# Patient Record
Sex: Male | Born: 1955 | Race: Black or African American | Hispanic: No | Marital: Single | Smoking: Current every day smoker
Health system: Southern US, Community
[De-identification: ages and names within clinical notes are randomized; demographics above are authoritative.]

## PROBLEM LIST (undated history)

## (undated) DIAGNOSIS — R011 Cardiac murmur, unspecified: Secondary | ICD-10-CM

---

## 1997-07-07 ENCOUNTER — Emergency Department (HOSPITAL_COMMUNITY): Admission: EM | Admit: 1997-07-07 | Discharge: 1997-07-07 | Payer: Self-pay | Admitting: Emergency Medicine

## 2006-01-05 ENCOUNTER — Emergency Department (HOSPITAL_COMMUNITY): Admission: EM | Admit: 2006-01-05 | Discharge: 2006-01-05 | Payer: Self-pay | Admitting: Emergency Medicine

## 2011-08-17 ENCOUNTER — Emergency Department (HOSPITAL_COMMUNITY): Payer: Self-pay

## 2011-08-17 ENCOUNTER — Inpatient Hospital Stay (HOSPITAL_COMMUNITY)
Admission: EM | Admit: 2011-08-17 | Discharge: 2011-08-19 | DRG: 066 | Disposition: A | Discharge status: Home / Self Care | Payer: Self-pay | Attending: Internal Medicine | Admitting: Internal Medicine

## 2011-08-17 ENCOUNTER — Encounter (HOSPITAL_COMMUNITY): Payer: Self-pay | Admitting: *Deleted

## 2011-08-17 DIAGNOSIS — I6350 Cerebral infarction due to unspecified occlusion or stenosis of unspecified cerebral artery: Principal | ICD-10-CM | POA: Diagnosis present

## 2011-08-17 DIAGNOSIS — R030 Elevated blood-pressure reading, without diagnosis of hypertension: Secondary | ICD-10-CM | POA: Diagnosis present

## 2011-08-17 DIAGNOSIS — F1210 Cannabis abuse, uncomplicated: Secondary | ICD-10-CM | POA: Diagnosis present

## 2011-08-17 DIAGNOSIS — R531 Weakness: Secondary | ICD-10-CM | POA: Diagnosis present

## 2011-08-17 DIAGNOSIS — F1910 Other psychoactive substance abuse, uncomplicated: Secondary | ICD-10-CM | POA: Diagnosis present

## 2011-08-17 DIAGNOSIS — Z8673 Personal history of transient ischemic attack (TIA), and cerebral infarction without residual deficits: Secondary | ICD-10-CM | POA: Diagnosis present

## 2011-08-17 DIAGNOSIS — R5381 Other malaise: Secondary | ICD-10-CM | POA: Diagnosis present

## 2011-08-17 DIAGNOSIS — F1410 Cocaine abuse, uncomplicated: Secondary | ICD-10-CM | POA: Diagnosis present

## 2011-08-17 DIAGNOSIS — IMO0001 Reserved for inherently not codable concepts without codable children: Secondary | ICD-10-CM | POA: Diagnosis present

## 2011-08-17 DIAGNOSIS — I639 Cerebral infarction, unspecified: Secondary | ICD-10-CM

## 2011-08-17 DIAGNOSIS — F17200 Nicotine dependence, unspecified, uncomplicated: Secondary | ICD-10-CM | POA: Diagnosis present

## 2011-08-17 DIAGNOSIS — G459 Transient cerebral ischemic attack, unspecified: Secondary | ICD-10-CM

## 2011-08-17 HISTORY — DX: Cardiac murmur, unspecified: R01.1

## 2011-08-17 LAB — COMPREHENSIVE METABOLIC PANEL
ALT: 13 U/L (ref 0–53)
BUN: 13 mg/dL (ref 6–23)
CO2: 26 mEq/L (ref 19–32)
Calcium: 9.1 mg/dL (ref 8.4–10.5)
Creatinine, Ser: 1.2 mg/dL (ref 0.50–1.35)
GFR calc Af Amer: 77 mL/min — ABNORMAL LOW (ref >90)
GFR calc non Af Amer: 66 mL/min — ABNORMAL LOW (ref >90)
Glucose, Bld: 96 mg/dL (ref 70–99)

## 2011-08-17 LAB — DIFFERENTIAL
Eosinophils Relative: 5 % (ref 0–5)
Lymphocytes Relative: 56 % — ABNORMAL HIGH (ref 12–46)
Lymphs Abs: 3.5 10*3/uL (ref 0.7–4.0)
Monocytes Absolute: 0.4 10*3/uL (ref 0.1–1.0)

## 2011-08-17 LAB — POCT I-STAT, CHEM 8
Calcium, Ion: 1.23 mmol/L (ref 1.12–1.32)
Chloride: 108 mEq/L (ref 96–112)
HCT: 39 % (ref 39.0–52.0)
TCO2: 24 mmol/L (ref 0–100)

## 2011-08-17 LAB — CBC
HCT: 37.3 % — ABNORMAL LOW (ref 39.0–52.0)
MCV: 91.2 fL (ref 78.0–100.0)
RBC: 4.09 MIL/uL — ABNORMAL LOW (ref 4.22–5.81)
WBC: 6.3 10*3/uL (ref 4.0–10.5)

## 2011-08-17 LAB — CK TOTAL AND CKMB (NOT AT ARMC)
CK, MB: 2.6 ng/mL (ref 0.3–4.0)
Relative Index: 1.2 (ref 0.0–2.5)

## 2011-08-17 NOTE — ED Notes (Signed)
Pt c/o of left sided weakness earlier today. Symptoms resolved prior to being roomed in the ER. Only complaint left pt has slightly less sensation on left side than right.

## 2011-08-17 NOTE — ED Provider Notes (Signed)
History     CSN: [id number]  Arrival date & time 08/17/11  2200   First MD Initiated Contact with Patient 08/17/11 2243      Chief Complaint  Patient presents with  . Weakness    (Consider location/radiation/quality/duration/timing/severity/associated sxs/prior treatment) HPI Comments: Patient arrives complaining of "cannot control the left side of my body". States he developed weakness, numbness and tingling in his left face, arm and leg about one hour ago. Symptoms lasted 30 minutes and now resolved. His similar episode yesterday that lasted about 10 minutes and resolved. EMS was called yesterday but did not transport him. He denies any history of high blood pressure diabetes. He admits to occasional cocaine and marijuana use. Last used cocaine 2 days ago. No chest pain, shortness of breath, headache, nausea, vomiting. No visual change. Difficulty talking or swallowing. Feels back to baseline now  The history is provided by the patient.    Past Medical History  Diagnosis Date  . Murmur     History reviewed. No pertinent past surgical history.  History reviewed. No pertinent family history.  History  Substance Use Topics  . Smoking status: Current Some Day Smoker -- 1.0 packs/day  . Smokeless tobacco: Not on file  . Alcohol Use: 0.6 oz/week    1 Shots of liquor per week     3x week      Review of Systems  Constitutional: Positive for activity change. Negative for fever.  HENT: Negative for congestion, rhinorrhea, neck pain and neck stiffness.   Eyes: Negative for visual disturbance.  Respiratory: Negative for cough, chest tightness and shortness of breath.   Cardiovascular: Negative for chest pain.  Gastrointestinal: Negative for nausea and abdominal pain.  Genitourinary: Negative for dysuria and hematuria.  Musculoskeletal: Negative for back pain.  Neurological: Positive for dizziness and weakness. Negative for speech difficulty and light-headedness.    Allergies    Review of patient's allergies indicates no known allergies.  Home Medications  No current outpatient prescriptions on file.  BP 155/98  Pulse 60  Temp(Src) 97.7 F (36.5 C) (Oral)  Resp 20  Ht 5\' 9"  (1.753 m)  Wt 165 lb (74.844 kg)  BMI 24.37 kg/m2  SpO2 100%  Physical Exam  Constitutional: He is oriented to person, place, and time. He appears well-developed and well-nourished. No distress.  HENT:  Head: Normocephalic and atraumatic.  Mouth/Throat: Oropharynx is clear and moist. No oropharyngeal exudate.  Eyes: Conjunctivae and EOM are normal. Pupils are equal, round, and reactive to light.  Neck: Normal range of motion. Neck supple.  Cardiovascular: Normal rate, regular rhythm and normal heart sounds.   No murmur heard. Pulmonary/Chest: Effort normal and breath sounds normal. No respiratory distress.  Abdominal: Soft. There is no tenderness. There is no rebound and no guarding.  Musculoskeletal: Normal range of motion. He exhibits no edema and no tenderness.  Neurological: He is alert and oriented to person, place, and time. No cranial nerve deficit.       5 out of 5 strength throughout, no facial asymmetry. Slight left nasolabial fold flattening. Smile symmetric, INR asymmetric, tongue midline. Grip Strength equal. No pronator drift. No ataxia finger to nose.  Skin: Skin is warm.    ED Course  Procedures (including critical care time)  Labs Reviewed  CBC - Abnormal; Notable for the following:    RBC 4.09 (*)    Hemoglobin 12.7 (*)    HCT 37.3 (*)    All other components within normal limits  DIFFERENTIAL -  Abnormal; Notable for the following:    Neutrophils Relative 33 (*)    Lymphocytes Relative 56 (*)    All other components within normal limits  COMPREHENSIVE METABOLIC PANEL - Abnormal; Notable for the following:    Total Protein 5.9 (*)    Albumin 3.2 (*)    Total Bilirubin 0.2 (*)    GFR calc non Af Amer 66 (*)    GFR calc Af Amer 77 (*)    All other  components within normal limits  PROTIME-INR  APTT  CK TOTAL AND CKMB  TROPONIN I  POCT I-STAT, CHEM 8  URINE RAPID DRUG SCREEN (HOSP PERFORMED)  URINALYSIS, ROUTINE W REFLEX MICROSCOPIC   Ct Head Wo Contrast  08/17/2011  *RADIOLOGY REPORT*  Clinical Data: Left-sided weakness  CT HEAD WITHOUT CONTRAST  Technique:  Contiguous axial images were obtained from the base of the skull through the vertex without contrast.  Comparison: None.  Findings: No evidence of parenchymal hemorrhage or extra-axial fluid collection. No mass lesion, mass effect, or midline shift.  No CT evidence of acute infarction.  Intracranial atherosclerosis.  Cerebral volume is age appropriate.  No ventriculomegaly.  Partial opacification of the bilateral ethmoid sinuses.  Visualized paranasal sinuses and mastoid air cells otherwise clear.  No evidence of calvarial fracture.  IMPRESSION: No evidence of acute intracranial abnormality.  Original Report Authenticated By: Charline Bills, M.D.     No diagnosis found.    MDM  Intermittent left-sided weakness twice in the past 2 days. Symptoms now resolved. Hypertensive. No chest pain or shortness of breath. No headache.  Left-sided weakness. Now resolved. Concern for TIA. Patient not thrombolytic candidate given rapid improvement in symptoms  CT head negative. Patient no further neurological deficits.     Date: 08/17/2011  Rate: 62  Rhythm: normal sinus rhythm  QRS Axis: normal  Intervals: normal  ST/T Wave abnormalities: normal  Conduction Disutrbances:none  Narrative Interpretation:   Old EKG Reviewed: unchanged    Glynn Octave, MD 08/18/11 0210

## 2011-08-17 NOTE — ED Notes (Signed)
Pt reports he felt Lt sided weakness about 1hr ago. Pt alert on arrival the patient denies a HX of CVA  Last known normal was 2100 tonight

## 2011-08-18 ENCOUNTER — Inpatient Hospital Stay (HOSPITAL_COMMUNITY): Payer: Self-pay

## 2011-08-18 DIAGNOSIS — R209 Unspecified disturbances of skin sensation: Secondary | ICD-10-CM

## 2011-08-18 DIAGNOSIS — R531 Weakness: Secondary | ICD-10-CM | POA: Diagnosis present

## 2011-08-18 DIAGNOSIS — F1910 Other psychoactive substance abuse, uncomplicated: Secondary | ICD-10-CM | POA: Diagnosis present

## 2011-08-18 DIAGNOSIS — F1410 Cocaine abuse, uncomplicated: Secondary | ICD-10-CM

## 2011-08-18 DIAGNOSIS — G459 Transient cerebral ischemic attack, unspecified: Secondary | ICD-10-CM

## 2011-08-18 DIAGNOSIS — R030 Elevated blood-pressure reading, without diagnosis of hypertension: Secondary | ICD-10-CM

## 2011-08-18 DIAGNOSIS — I639 Cerebral infarction, unspecified: Secondary | ICD-10-CM | POA: Diagnosis present

## 2011-08-18 HISTORY — DX: Weakness: R53.1

## 2011-08-18 LAB — URINE MICROSCOPIC-ADD ON

## 2011-08-18 LAB — RAPID URINE DRUG SCREEN, HOSP PERFORMED
Barbiturates: NOT DETECTED
Benzodiazepines: NOT DETECTED

## 2011-08-18 LAB — URINALYSIS, ROUTINE W REFLEX MICROSCOPIC
Ketones, ur: NEGATIVE mg/dL
Nitrite: NEGATIVE
pH: 6 (ref 5.0–8.0)

## 2011-08-18 LAB — LIPID PANEL
HDL: 101 mg/dL (ref >39)
LDL Cholesterol: 92 mg/dL (ref 0–99)
Triglycerides: 69 mg/dL (ref <150)
VLDL: 14 mg/dL (ref 0–40)

## 2011-08-18 MED ORDER — SENNOSIDES-DOCUSATE SODIUM 8.6-50 MG PO TABS: 1.0000 | ORAL_TABLET | Freq: Every evening | ORAL | Status: DC | PRN

## 2011-08-18 MED ORDER — ASPIRIN 325 MG PO TABS
325.0000 mg | ORAL_TABLET | Freq: Every day | ORAL | Status: DC
  Administered 2011-08-18 – 2011-08-19 (×2): 325 mg via ORAL
  Filled 2011-08-18 (×2): qty 1

## 2011-08-18 MED ORDER — SODIUM CHLORIDE 0.9 % IJ SOLN
3.0000 mL | Freq: Two times a day (BID) | INTRAMUSCULAR | Status: DC
  Administered 2011-08-18 – 2011-08-19 (×3): 3 mL via INTRAVENOUS

## 2011-08-18 MED ORDER — SODIUM CHLORIDE 0.9 % IV SOLN: INTRAVENOUS | Status: DC

## 2011-08-18 MED ORDER — SODIUM CHLORIDE 0.9 % IJ SOLN: 3.0000 mL | INTRAMUSCULAR | Status: DC | PRN

## 2011-08-18 MED ORDER — ASPIRIN 325 MG PO TABS: 325.0000 mg | ORAL_TABLET | Freq: Every day | ORAL | Status: AC

## 2011-08-18 MED ORDER — ASPIRIN 300 MG RE SUPP
300.0000 mg | Freq: Every day | RECTAL | Status: DC
  Filled 2011-08-18 (×2): qty 1

## 2011-08-18 MED ORDER — ASPIRIN 81 MG PO CHEW
324.0000 mg | CHEWABLE_TABLET | Freq: Once | ORAL | Status: AC
  Administered 2011-08-18: 324 mg via ORAL
  Filled 2011-08-18: qty 4

## 2011-08-18 MED ORDER — SODIUM CHLORIDE 0.9 % IV SOLN: 250.0000 mL | INTRAVENOUS | Status: DC | PRN

## 2011-08-18 NOTE — Evaluation (Signed)
Speech Language Pathology Evaluation Patient Details Name: Nevin Grizzle MRN: [id number] DOB: 1955-08-29 Today's Date: 08/18/2011 Time: 1200-1210 SLP Time Calculation (min): 10 min  Problem List:  Patient Active Problem List  Diagnoses  . Weakness of one side of body  . TIA (transient ischemic attack)  . Polysubstance abuse  . Elevated BP   Past Medical History:  Past Medical History  Diagnosis Date  . Murmur    Past Surgical History: History reviewed. No pertinent past surgical history. HPI:  56 year old male admitted 08/17/11 with left side weakness, now resolved. PMH significant for heart murmur, cocaine and marijuana use.   Assessment / Plan / Recommendation Clinical Impression  Pt currently at baseline for speech, language, and cognition.  No difficulty swallowing observed or reported.  No further acute SLP needs identified at this time.      SLP Assessment  Patient does not need any further Speech Lanaguage Pathology Services          Pertinent Vitals/Pain None reported     SLP Evaluation Prior Functioning  Cognitive/Linguistic Baseline: Within functional limits   Cognition  Overall Cognitive Status: Appears within functional limits for tasks assessed    Comprehension  Auditory Comprehension Overall Auditory Comprehension: Appears within functional limits for tasks assessed    Expression Expression Primary Mode of Expression: Verbal Verbal Expression Overall Verbal Expression: Appears within functional limits for tasks assessed   Oral / Motor Oral Motor/Sensory Function Overall Oral Motor/Sensory Function: Appears within functional limits for tasks assessed Motor Speech Overall Motor Speech: Appears within functional limits for tasks assessed   Deija Buhrman B. [place], [facility], CCC-SLP [phone]   Leigh Aurora 08/18/2011, 12:18 PM

## 2011-08-18 NOTE — Evaluation (Signed)
Physical Therapy Evaluation Patient Details Name: Shane Franklin MRN: [id number] DOB: [birth year] Today's Date: 08/18/2011 Time: 9629-5284 PT Time Calculation (min): 19 min  PT Assessment / Plan / Recommendation Clinical Impression  56 year old admitted with TIA. Pt appears back to baseline, no significant balance deficits noted. Pt scored 23/24 on the Dynamic Gait Index (<19 indicates increased risk for falls). No further therapies indicated at this time.  PT signing off.    PT Assessment  Patent does not need any further PT services    Follow Up Recommendations  No PT follow up       lEquipment Recommendations  None recommended by PT      Mobility  Bed Mobility Bed Mobility: Supine to Sit Supine to Sit: 7: Independent Transfers Transfers: Sit to Stand;Stand to Sit Sit to Stand: 7: Independent Stand to Sit: 7: Independent Ambulation/Gait Ambulation/Gait Assistance: 5: Supervision;6: Modified independent (Device/Increase time) Ambulation Distance (Feet): 300 Feet Assistive device: None Ambulation/Gait Assistance Details: Supervision initially progressing to modified independent. Gait Pattern: Step-through pattern Gait velocity: Slightly decreased speed, improved with time. Stairs: Yes Stairs Assistance: 5: Supervision;6: Modified independent (Device/Increase time) Stairs Assistance Details (indicate cue type and reason): Supervision progressing to modified independent Stair Management Technique: One rail Right;No rails Number of Stairs: 30  Modified Rankin (Stroke Patients Only) Pre-Morbid Rankin Score: No symptoms Modified Rankin: No symptoms    Exercises Other Exercises Other Exercises: Pt encouraged to walk as much as possible with nursing while here.    Visit Information  Last PT Received On: 08/18/11 Assistance Needed: +1    Subjective Data  Subjective: I'm good Patient Stated Goal: Get back to work   Prior [employer] Living Lives With: Other  (Comment) (brother) Available Help at Discharge: Family Type of Home: House Home Access: Stairs to enter [employer] of Steps: ~30 (house is on top of a hill) Entrance Stairs-Rails: Right Home Layout: One level Bathroom Shower/Tub: Engineer, manufacturing systems: Standard Bathroom Accessibility: Yes How Accessible: Accessible via walker Home Adaptive Equipment: None Additional Comments: WHen pt works in [place] does not stay. When with brother he will be able to proved 24/7 supervision. 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 Communication Communication: No difficulties Dominant Hand: Right    Cognition  Overall Cognitive Status: Appears within functional limits for tasks assessed/performed Arousal/Alertness: Awake/alert Orientation Level: Appears intact for tasks assessed Behavior During Session: Shane Franklin for tasks performed    Extremity/Trunk Assessment Right Upper Extremity Assessment RUE ROM/Strength/Tone: Within functional levels Left Upper Extremity Assessment LUE ROM/Strength/Tone: Within functional levels Right Lower Extremity Assessment RLE ROM/Strength/Tone: Within functional levels RLE Sensation: WFL - Light Touch Left Lower Extremity Assessment LLE ROM/Strength/Tone: Within functional levels LLE Sensation: WFL - Light Touch Trunk Assessment Trunk Assessment: Normal   Balance Balance Balance Assessed: Yes Standardized Balance Assessment Standardized Balance Assessment: Dynamic Gait Index Dynamic Gait Index Level Surface: Normal Change in Gait Speed: Normal Gait with Horizontal Head Turns: Normal Gait with Vertical Head Turns: Mild Impairment Gait and Pivot Turn: Normal Step Over Obstacle: Normal Step Around Obstacles: Normal Steps: Normal Total Score: 23   End of Session PT - End of  Session Equipment Utilized During Treatment: Gait belt Activity Tolerance: Patient tolerated treatment well Patient left: in bed;with call bell/phone within reach;with bed alarm set Nurse Communication: Mobility status   Shane Franklin 08/18/2011, 1:29  PM  Shane Franklin (Shane Franklin) Shane Franklin PT, DPT Acute Rehabilitation [phone]

## 2011-08-18 NOTE — Progress Notes (Signed)
Physical Therapy Discharge Patient Details Name: Cline Draheim MRN: [id number] DOB: [birth year] Today's Date: 08/18/2011 Time: 7829-5621 PT Time Calculation (min): 19 min  Patient discharged from PT services secondary to pt at baseline level of functioning..  Please see latest therapy progress note for current level of functioning and progress toward goals.    Progress and discharge plan discussed with patient and/or caregiver: Patient/Caregiver agrees with plan  [name] 08/18/2011, 1:30 PM

## 2011-08-18 NOTE — H&P (Signed)
Chief Complaint:  Left sided numbness and weakness  HPI: 56 year old male who has had several episodes of left arm and leg numbness and weakness that lasted less than 10 minutes with associated left-sided facial numbness also. His symptoms are currently resolved. He denies any fevers. He denies any headache. He denies any slurred speech. Has no known history of CVA in the past or hypertension. He does admit to doing cocaine and marijuana on occasion. no rashes. No chest pain or shortness of breath.   Review of Systems:   otherwise negative  Past Medical History: Past Medical History  Diagnosis Date  . Murmur    History reviewed. No pertinent past surgical history.  Medications: Prior to Admission medications   Not on File    Allergies:  No Known Allergies  Social History:  reports that he has been smoking.  He does not have any smokeless tobacco history on file. He reports that he drinks about .6 ounces of alcohol per week. He reports that he uses illicit drugs (Cocaine and Marijuana).  Family History: History reviewed. No pertinent family history.  Physical Exam: Filed Vitals:   08/17/11 2209 08/17/11 2320 08/17/11 2322  BP: 184/100 155/98   Pulse: 72 60   Temp: 97.9 F (36.6 C) 97.7 F (36.5 C) 97.7 F (36.5 C)  TempSrc: Oral Oral   Resp: 20    Height: 5\' 9"  (1.753 m)    Weight: 74.844 kg (165 lb)    SpO2: 95% 100%    General appearance: alert, cooperative and no distress Neck: no carotid bruit, no JVD and supple, symmetrical, trachea midline Lungs: clear to auscultation bilaterally Heart: regular rate and rhythm, S1, S2 normal, no murmur, click, rub or gallop Abdomen: soft, non-tender; bowel sounds normal; no masses,  no organomegaly Extremities: extremities normal, atraumatic, no cyanosis or edema Pulses: 2+ and symmetric Skin: Skin color, texture, turgor normal. No rashes or lesions Neurologic: Grossly normal    Labs on Admission:   [facility] 08/17/11  2326 08/17/11 2301  NA 143 144  K 3.7 4.0  CL 108 109  CO2 -- 26  GLUCOSE 94 96  BUN 13 13  CREATININE 1.10 1.20  CALCIUM -- 9.1  MG -- --  PHOS -- --    Basename 08/17/11 2301  AST 15  ALT 13  ALKPHOS 52  BILITOT 0.2*  PROT 5.9*  ALBUMIN 3.2*    Basename 08/17/11 2326 08/17/11 2301  WBC -- 6.3  NEUTROABS -- 2.1  HGB 13.3 12.7*  HCT 39.0 37.3*  MCV -- 91.2  PLT -- 151    Basename 08/17/11 2301  CKTOTAL 219  CKMB 2.6  CKMBINDEX --  TROPONINI <0.30    Radiological Exams on Admission: Ct Head Wo Contrast  08/17/2011  *RADIOLOGY REPORT*  Clinical Data: Left-sided weakness  CT HEAD WITHOUT CONTRAST  Technique:  Contiguous axial images were obtained from the base of the skull through the vertex without contrast.  Comparison: None.  Findings: No evidence of parenchymal hemorrhage or extra-axial fluid collection. No mass lesion, mass effect, or midline shift.  No CT evidence of acute infarction.  Intracranial atherosclerosis.  Cerebral volume is age appropriate.  No ventriculomegaly.  Partial opacification of the bilateral ethmoid sinuses.  Visualized paranasal sinuses and mastoid air cells otherwise clear.  No evidence of calvarial fracture.  IMPRESSION: No evidence of acute intracranial abnormality.  Original Report Authenticated By: Charline Bills, M.D.    Assessment/Plan Present on Admission:  56 year old male with TIA-like symptoms.  .Weakness  of one side of body .TIA (transient ischemic attack) .Polysubstance abuse .Elevated BP   place on CVA pathway with workup including MRI carotid Dopplers and 2-D echo. Her drug screen is pending. He does have elevated blood pressure without diagnosis of history of hypertension. Monitor for now will likely need to be started on antihypertensive prior to his discharge. Place on aspirin. Place on telemetry for any arrhythmias. 12 EKG is negative. Obtain frequent neurological checks. Further workup pending on the results of the  above.   Bessie Livingood [phone] 08/18/2011, 12:35 AM

## 2011-08-18 NOTE — Plan of Care (Signed)
Problem: Phase II Progression Outcomes Goal: Able to communicate Outcome: Completed/Met Date Met:  08/18/11 Dehlia Kilner B. [place], MSP, CCC-SLP [phone]

## 2011-08-18 NOTE — Progress Notes (Signed)
Subjective: Patient feeling much better. His weakness has completely resolved. He has no complaints. He says he is back to his baseline.  Objective: Weight change:   Intake/Output Summary (Last 24 hours) at 08/18/11 1933 Last data filed at 08/18/11 1300  Gross per 24 hour  Intake    720 ml  Output      0 ml  Net    720 ml    Filed Vitals:   08/18/11 1828  BP: 139/76  Pulse: 72  Temp: 97.8 F (36.6 C)  Resp: 18   General: Alert and oriented x3, no acute distress HEENT: Normocephalic, atraumatic, mucous members are moist. Cranial nerves II through XII are intact Cardiovascular: Regular rate and rhythm, S1-S2 Lungs: Clear to auscultation bilaterally Abdomen: Soft, nontender, nondistended, positive bowel sounds Extremities: No clubbing or cyanosis or edema Neuro: Nonfocal, all symmetric  Lab Results: Basic Metabolic Panel:  Basename 08/17/11 2326 08/17/11 2301  NA 143 144  K 3.7 4.0  CL 108 109  CO2 -- 26  GLUCOSE 94 96  BUN 13 13  CREATININE 1.10 1.20  CALCIUM -- 9.1  MG -- --  PHOS -- --   Liver Function Tests:  [facility] 08/17/11 2301  AST 15  ALT 13  ALKPHOS 52  BILITOT 0.2*  PROT 5.9*  ALBUMIN 3.2*   CBC:  Basename 08/17/11 2326 08/17/11 2301  WBC -- 6.3  NEUTROABS -- 2.1  HGB 13.3 12.7*  HCT 39.0 37.3*  MCV -- 91.2  PLT -- 151   Cardiac Enzymes:  Basename 08/17/11 2301  CKTOTAL 219  CKMB 2.6  CKMBINDEX --  TROPONINI <0.30   Hemoglobin A1C:  Basename 08/18/11 0520  HGBA1C 6.0*   Fasting Lipid Panel:  Basename 08/18/11 0520  CHOL 207*  HDL 101  LDLCALC 92  TRIG 69  CHOLHDL 2.0  LDLDIRECT --   Coagulation:  Basename 08/17/11 2301  LABPROT 12.0  INR 0.87   Urine Drug Screen: Drugs of Abuse     Component Value Date/Time   LABOPIA NONE DETECTED 08/18/2011 0132   COCAINSCRNUR POSITIVE* 08/18/2011 0132   LABBENZ NONE DETECTED 08/18/2011 0132   AMPHETMU NONE DETECTED 08/18/2011 0132   THCU POSITIVE* 08/18/2011 0132   LABBARB NONE  DETECTED 08/18/2011 0132   Urinalysis:  Basename 08/18/11 0130  COLORURINE YELLOW  LABSPEC 1.018  PHURINE 6.0  GLUCOSEU NEGATIVE  HGBUR NEGATIVE  BILIRUBINUR NEGATIVE  KETONESUR NEGATIVE  PROTEINUR NEGATIVE  UROBILINOGEN 0.2  NITRITE NEGATIVE  LEUKOCYTESUR TRACE*    Studies/Results: Ct Head Wo Contrast  08/17/2011  .  IMPRESSION: No evidence of acute intracranial abnormality.  Original Report Authenticated By: Charline Bills, M.D.   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    MRA HEAD   IMPRESSION: Negative MR angiography of the intracranial circulation.  No lesions are identified in the right internal carotid or right MCA vessels.      Medications: Scheduled Meds:   . aspirin  324 mg Oral Once  . aspirin  300 mg Rectal Daily   Or  . aspirin  325 mg Oral Daily  . sodium chloride  3 mL Intravenous Q12H  . DISCONTD: sodium chloride   Intravenous STAT   Continuous Infusions:  PRN Meds:.sodium chloride, senna-docusate, sodium chloride  Assessment/Plan: Patient Active Hospital Problem List: Acute CVA: Risk factors evaluated.blood pressure stable  no sign diabetes. Cholesterol LDL is 90. Will give a daily aspirin and he needs to avoid cocaine. Awaiting Dopplers and echocardiogram.  Weakness of one side of body (08/18/2011) Secondary to CVA-resolved.  Polysubstance abuse (08/18/2011) Counseled.  Elevated BP (08/18/2011)  BP better once cocaine out of system   LOS: 1 day   Lene Mckay K 08/18/2011, 7:33 PM

## 2011-08-19 DIAGNOSIS — R209 Unspecified disturbances of skin sensation: Secondary | ICD-10-CM

## 2011-08-19 DIAGNOSIS — R030 Elevated blood-pressure reading, without diagnosis of hypertension: Secondary | ICD-10-CM

## 2011-08-19 DIAGNOSIS — F1410 Cocaine abuse, uncomplicated: Secondary | ICD-10-CM

## 2011-08-19 DIAGNOSIS — G459 Transient cerebral ischemic attack, unspecified: Secondary | ICD-10-CM

## 2011-08-19 DIAGNOSIS — I6789 Other cerebrovascular disease: Secondary | ICD-10-CM

## 2011-08-19 NOTE — Discharge Summary (Signed)
DISCHARGE SUMMARY  [name]  MR#: [id number]  DOB:February 22, 1956  Date of Admission: 08/17/2011 Date of Discharge: 08/19/2011  Attending Physician:Aleem Elza K  Patient's Shane Franklin:WJXBJYN,WGNFAOZH, MD, MD  Consults: -none  Discharge Diagnoses: Present on Admission:  .Weakness of one side of body .CVA (cerebrovascular accident) .Polysubstance abuse .Elevated BP   Initial presentation: Patient is a 56 year old African American male past medical history of cocaine and marijuana abuse who presented with an episode of left arm and leg numbness and weakness that lasted about 10 minutes. He became concerned and came to the emergency room. His symptoms had resolved by the time he got to the emergency room. He is not had any previous history of any medical problems, although is not really ever seen a primary care physician. He was admitted to the hospitalist service for further workup and evaluation.  Hospital Course: Patient Active Problem List  Diagnoses  . Weakness of one side of body: Resolved see below.   . CVA (cerebrovascular accident): Patient's initial CT scan of the head was negative. However his MRI was positive for an acute CVA noting:Acute subcentimeter non hemorrhagic right MCA lenticulostriate  territory infarct affects the right posterior frontal  periventricular white matter.  The rest of his workup was unremarkable. Patient had negative carotid Dopplers. His cholesterol level was unremarkable at with an LDL of 70. Patient's blood pressure was elevated at times but at rest and his baseline is only 125/77. His A1c was at 6.0 I counseled him about exercise and relieved needing to be careful about his weight said that he does not go from prediabetic 2 diabetic. In the end, his major risk factor is of cocaine abuse and the plan will be for patient to stop doing drugs and to take a daily aspirin. He says he will indeed do so.   . Polysubstance abuse: Patient counseled.   .  Elevated BP: See above.     Medication List  As of 08/19/2011  3:40 PM   TAKE these medications         aspirin 325 MG tablet   Take 1 tablet (325 mg total) by mouth daily.             Day of Discharge BP 127/77  Pulse 63  Temp(Src) 98.5 F (36.9 C) (Oral)  Resp 18  Ht 5\' 9"  (1.753 m)  Wt 77.1 kg (169 lb 15.6 oz)  BMI 25.10 kg/m2  SpO2 98%  Physical Exam: General: Alert and oriented x3, no acute distress, looks about stated age, fatigued HEENT: Normocephalic, atraumatic, mucous membranes are moist Cardiovascular: Regular rate and rhythm, S1-S2 Lungs: Clear to auscultation bilaterally Abdomen: Soft, nontender, nondistended, positive bowel sounds Extremities: The clubbing or cyanosis or edema Neuro: Nonfocal, no deficits  Results for orders placed during the hospital encounter of 08/17/11 (from the past 24 hour(s))  GLUCOSE, CAPILLARY     Status: Abnormal   Collection Time   08/19/11  6:58 AM      Component Value Range   Glucose-Capillary 114 (*) 70 - 99 (mg/dL)    Disposition: improved, being discharged home. Patient was evaluated by speech therapy and found to have no deficits. His evaluated by PT and found to have no needs.   Follow-up Appts: patient will followup with neurology in 3 months.  He is being signed up for Medicaid and at that time will establish with a primary care physician.    Tests Needing Follow-up: none  Time spent in discharge (includes decision making & examination of  pt): 40 minutes  Signed: Hollice Espy 08/19/2011, 3:40 PM

## 2011-08-19 NOTE — Care Management Note (Signed)
    Page 1 of 1   08/19/2011     12:01:04 PM   CARE MANAGEMENT NOTE 08/19/2011  Patient:  Shane Franklin, Shane Franklin   Account Number:  [id number]  Date Initiated:  08/19/2011  Documentation initiated by:  Onnie Boer  Subjective/Objective Assessment:   PT WAS ADMITTED WITH WEAKNESS     Action/Plan:   PROGRESSION OF CARE AND DISCHARGE PLANNING   Anticipated DC Date:  08/21/2011   Anticipated DC Plan:  HOME/SELF CARE  In-house referral  Clinical Social Worker      DC Planning Services  CM consult      Choice offered to / List presented to:             Status of service:  In process, will continue to follow Medicare Important Message given?   (If response is "NO", the following Medicare IM given date fields will be blank) Date Medicare IM given:   Date Additional Medicare IM given:    Discharge Disposition:    Per UR Regulation:  Reviewed for med. necessity/level of care/duration of stay  If discussed at Long Length of Stay Meetings, dates discussed:    Comments:  08/19/11 Onnie Boer, RN, BSN 1159 PT WAS ADMITTED FOR CVA.  PTA PT WAS AT HOME WITH SELF CARE.  CSW HAS GIVEN PT INFORMATION ON APPLICATION FOR MEDICAID AND THE FINANCIAL COORDINATORS NUMBER.  I HAVE GIVEN THE PT INFORMATION ON THE [facility] FOR PCP APPTS. WILL F/U ON OTHER DC NEEDS.

## 2011-08-19 NOTE — Progress Notes (Signed)
*  PRELIMINARY RESULTS* Vascular Ultrasound Carotid Duplex (Doppler) has been completed.  No evidence of internal carotid artery stenosis bilaterally. Bilateral antegrade vertebral artery flow.  Malachy Moan, RDMS, RDCS 08/19/2011, 2:41 PM

## 2011-08-19 NOTE — Progress Notes (Signed)
Pt given diischarge instructions, stressed importance of taking medications, to ber aware of signs of TIA?stroke. And follow up with drs.  No RX to go. No questions at this time.

## 2011-08-19 NOTE — Evaluation (Signed)
Occupational Therapy Evaluation Patient Details Name: Shane Franklin MRN: [id number] DOB: [birth year] Today's Date: 08/19/2011 Time: 4098-1191 OT Time Calculation (min): 16 min  OT Assessment / Plan / Recommendation Clinical Impression  This 56 y.o. male admitted for small CVA.  Pt appears to be back to baseline.  He has reported a change in ability to read - words running together for ~ 8 months.  He was able to read small newspaper print during eval.  Vision appears to be at baseline, per pt. report, but recommended he see ophthalmogist.  He verbalized undertanding    OT Assessment  Patient does not need any further OT services    Follow Up Recommendations  No OT follow up    Barriers to Discharge      Equipment Recommendations  None recommended by OT    Recommendations for Other Services    Frequency       Precautions / Restrictions         ADL  Eating/Feeding: Simulated;Independent Where Assessed - Eating/Feeding: Bed level ADL Comments: Pt. reports he has been performing ADLs in room independently.  Pt. is independent with ADLs and no physical limitations.  Pt. instructed in s/s of CVA -- pt. able to teach back ~50% of information.  Verbalizes understanding of need to seek immediate medical attention if he experiences signs of CVA.  Pt. also asking about Hypertension - instructed him to discuss further with MD, but did discuss drug/cocaine use and negative effects on his system including hypertension.  He verbalized understanding    OT Diagnosis:    OT Problem List:   OT Treatment Interventions:     OT Goals    Visit Information  Last OT Received On: 08/19/11 Assistance Needed: +1    Subjective Data  Subjective: "I've been having trouble reading for about 8 months.  The words all run together" Patient Stated Goal: Did not state   Prior Functioning  Home Living Lives With: Other (Comment) (brother) Available Help at Discharge: Family Type of Home: House Home  Access: Stairs to enter [employer] of Steps: ~30 (house is on top of a hill) Entrance Stairs-Rails: Right Home Layout: One level Bathroom Shower/Tub: Engineer, manufacturing systems: Standard Bathroom Accessibility: Yes How Accessible: Accessible via walker Home Adaptive Equipment: None Additional Comments: WHen pt works in [place] does not stay. When with brother he will be able to proved 24/7 supervision. Prior Function Level of Independence: Independent Able to Take Stairs?: Yes Driving: No Vocation: Part time employment Communication Communication: No difficulties Dominant Hand: Right    Cognition  Overall Cognitive Status: Appears within functional limits for tasks assessed/performed Arousal/Alertness: Awake/alert Orientation Level: Appears intact for tasks assessed Behavior During Session: [facility] for tasks performed    Extremity/Trunk Assessment Right Upper Extremity Assessment RUE ROM/Strength/Tone: Within functional levels RUE Sensation: WFL - Light Touch RUE Coordination: WFL - gross/fine motor Left Upper Extremity Assessment LUE ROM/Strength/Tone: Within functional levels LUE Sensation: WFL - Light Touch LUE Coordination: WFL - gross/fine motor   Mobility     Exercise    Balance    End of Session OT - End of Session Activity Tolerance: Patient tolerated treatment well Patient left: in bed;with call bell/phone within reach   Winferd Wease M 08/19/2011, 2:38 PM

## 2011-08-19 NOTE — Clinical Social Work Psychosocial (Signed)
     Clinical Social Work Department BRIEF PSYCHOSOCIAL ASSESSMENT 08/19/2011  Patient:  Shane Franklin, Shane Franklin     Account Number:  [id number]     Admit date:  08/17/2011  Clinical Social Worker:  Peggyann Shoals  Date/Time:  08/19/2011 11:31 AM  Referred by:  Physician  Date Referred:  08/18/2011 Referred for  Other - See comment   Other Referral:   Assistance with Medicaid   Interview type:  Patient Other interview type:    PSYCHOSOCIAL DATA Living Status:  FAMILY Admitted from facility:   Level of care:   Primary support name:  Cecilio Asper Primary support relationship to patient:  FRIEND Degree of support available:   Adequate.    CURRENT CONCERNS Current Concerns  Other - See comment   Other Concerns:   Access to Medicaid    SOCIAL WORK ASSESSMENT / PLAN CSW met with pt to address consult. CSW introduced herself and explained role of social work. Pt lives with his brother and works part time. Pt lacks insurance. Pt is interested in applying for Medicaid. CSW explained Medicaid process. Pt shared that he understood and did not have any questions at this time. CSW provided information on [facility] DSS to apply for Medicaid. CSW also contacted [employer] regarding pt's case. CSW is signing off as no further needs identified. RNCM is aware and following pt as well. Please reconsult if a need arises prior to discharge. Pt will be discharging home.   Assessment/plan status:  No Further Intervention Required Other assessment/ plan:   Information/referral to community resources:   [facility] DSS for applying for [employer] and other services.    PATIENTS/FAMILYS RESPONSE TO PLAN OF CARE: Pt was alert and oriented. Pt shared that he will be applying for Medicaid. Pt is agreeable to discharge plan to home.

## 2011-08-19 NOTE — Discharge Instructions (Signed)
STROKE/TIA DISCHARGE INSTRUCTIONS SMOKING Cigarette smoking nearly doubles your risk of having a stroke & is the single most alterable risk factor  If you smoke or have smoked in the last 12 months, you are advised to quit smoking for your health.  Most of the excess cardiovascular risk related to smoking disappears within a year of stopping.  Ask you doctor about anti-smoking medications  [place] Quit Line: 1-800-QUIT NOW  Free Smoking Cessation Classes [phone]  CHOLESTEROL Know your levels; limit fat & cholesterol in your diet  Lipid Panel     Component Value Date/Time   CHOL 207* 08/18/2011 0520   TRIG 69 08/18/2011 0520   HDL 101 08/18/2011 0520   CHOLHDL 2.0 08/18/2011 0520   VLDL 14 08/18/2011 0520   LDLCALC 92 08/18/2011 0520      Many patients benefit from treatment even if their cholesterol is at goal.  Goal: Total Cholesterol (CHOL) less than 160  Goal:  Triglycerides (TRIG) less than 150  Goal:  HDL greater than 40  Goal:  LDL (LDLCALC) less than 100   BLOOD PRESSURE American Stroke Association blood pressure target is less that 120/80 mm/Hg  Your discharge blood pressure is:  BP: 137/86 mmHg  Monitor your blood pressure  Limit your salt and alcohol intake  Many individuals will require more than one medication for high blood pressure  DIABETES (A1c is a blood sugar average for last 3 months) Goal HGBA1c is under 7% (HBGA1c is blood sugar average for last 3 months)  Diabetes: {STROKE DC DIABETES:22357}    Lab Results  Component Value Date   HGBA1C 6.0* 08/18/2011     Your HGBA1c can be lowered with medications, healthy diet, and exercise.  Check your blood sugar as directed by your physician  Call your physician if you experience unexplained or low blood sugars.  PHYSICAL ACTIVITY/REHABILITATION Goal is 30 minutes at least 4 days per week    {STROKE DC ACTIVITY/REHAB:22359}  Activity decreases your risk of heart attack and stroke and makes your heart stronger.   It helps control your weight and blood pressure; helps you relax and can improve your mood.  Participate in a regular exercise program.  Talk with your doctor about the best form of exercise for you (dancing, walking, swimming, cycling).  DIET/WEIGHT Goal is to maintain a healthy weight  Your discharge diet is: Cardiac *** liquids Your height is:  Height: 5\' 9"  (175.3 cm) Your current weight is: Weight: 77.1 kg (169 lb 15.6 oz) Your Body Mass Index (BMI) is:  BMI (Calculated): 25.2   Following the type of diet specifically designed for you will help prevent another stroke.  Your goal weight range is:  ***  Your goal Body Mass Index (BMI) is 19-24.  Healthy food habits can help reduce 3 risk factors for stroke:  High cholesterol, hypertension, and excess weight.  RESOURCES Stroke/Support Group:  Call [phone]  they meet the 3rd Sunday of the month on the Rehab Unit at [facility], [place] ( no meetings June, July & Aug).  STROKE EDUCATION PROVIDED/REVIEWED AND GIVEN TO PATIENT Stroke warning signs and symptoms How to activate emergency medical system (call 911). Medications prescribed at discharge. Need for follow-up after discharge. Personal risk factors for stroke. Pneumonia vaccine given:   {STROKE DC YES/NO/DATE:22363} Flu vaccine given:   {STROKE DC YES/NO/DATE:22363} My questions have been answered, the writing is legible, and I understand these instructions.  I will adhere to these goals & educational materials that have  been provided to me after my discharge from the hospital.

## 2013-01-01 ENCOUNTER — Emergency Department (HOSPITAL_COMMUNITY): Payer: Self-pay

## 2013-01-01 ENCOUNTER — Encounter (HOSPITAL_COMMUNITY): Payer: Self-pay | Admitting: Emergency Medicine

## 2013-01-01 ENCOUNTER — Emergency Department (HOSPITAL_COMMUNITY)
Admission: EM | Admit: 2013-01-01 | Discharge: 2013-01-01 | Disposition: A | Discharge status: Home / Self Care | Payer: Self-pay | Attending: Emergency Medicine | Admitting: Emergency Medicine

## 2013-01-01 DIAGNOSIS — Y9389 Activity, other specified: Secondary | ICD-10-CM | POA: Insufficient documentation

## 2013-01-01 DIAGNOSIS — F17200 Nicotine dependence, unspecified, uncomplicated: Secondary | ICD-10-CM | POA: Insufficient documentation

## 2013-01-01 DIAGNOSIS — S60229A Contusion of unspecified hand, initial encounter: Secondary | ICD-10-CM | POA: Insufficient documentation

## 2013-01-01 DIAGNOSIS — S60221A Contusion of right hand, initial encounter: Secondary | ICD-10-CM

## 2013-01-01 DIAGNOSIS — IMO0002 Reserved for concepts with insufficient information to code with codable children: Secondary | ICD-10-CM | POA: Insufficient documentation

## 2013-01-01 DIAGNOSIS — R011 Cardiac murmur, unspecified: Secondary | ICD-10-CM | POA: Insufficient documentation

## 2013-01-01 DIAGNOSIS — Y929 Unspecified place or not applicable: Secondary | ICD-10-CM | POA: Insufficient documentation

## 2013-01-01 MED ORDER — IBUPROFEN 600 MG PO TABS: 600.0000 mg | ORAL_TABLET | Freq: Four times a day (QID) | ORAL | Status: DC | PRN

## 2013-01-01 MED ORDER — HYDROCODONE-ACETAMINOPHEN 5-325 MG PO TABS: 1.0000 | ORAL_TABLET | ORAL | Status: DC | PRN

## 2013-01-01 NOTE — ED Notes (Signed)
Pt. States that Wednesday a week ago he hit a wall. States it has been swollen since that time. States he is able to move fingers.

## 2013-01-01 NOTE — ED Provider Notes (Signed)
CSN: [id number]     Arrival date & time 01/01/13  1726 History  This chart was scribed for non-physician practitioner Marlon Pel working with Enid Skeens, MD by Carl Best, ED Scribe. This patient was seen in room TR06C/TR06C and the patient's care was started at 6:46 PM.       Chief Complaint  Patient presents with  . Hand Problem    The history is provided by the patient. No language interpreter was used.   HPI Comments: Shane Franklin is a 57 y.o. male who presents to the Emergency Department complaining of right hand pain that started a week ago after the patient punched a wall.  The patient denies nausea, emesis, and fever as associated symptoms.  The patient states that he iced the right hand one time to reduce the swelling but did not experience any relief in his symptoms.  The patient states that he is able to move his fingers.  The patient denies having an open wound on the right hand. No fevers  Past Medical History  Diagnosis Date  . Murmur    History reviewed. No pertinent past surgical history. History reviewed. No pertinent family history. History  Substance Use Topics  . Smoking status: Current Some Day Smoker -- 1.00 packs/day  . Smokeless tobacco: Not on file  . Alcohol Use: 0.6 oz/week    1 Shots of liquor per week     Comment: every other day either wine cooler or liquor    Review of Systems  Constitutional: Negative for fever.  Gastrointestinal: Negative for nausea and vomiting.  Musculoskeletal: Positive for arthralgias (right hand) and joint swelling (right hand).  Skin: Negative for wound.  All other systems reviewed and are negative.    Allergies  Review of patient's allergies indicates no known allergies.  Home Medications   Current Outpatient Rx  Name  Route  Sig  Dispense  Refill  . naproxen sodium (ALEVE) 220 MG tablet   Oral   Take 220 mg by mouth 2 (two) times daily as needed (pain).         Marland Kitchen HYDROcodone-acetaminophen  (NORCO/VICODIN) 5-325 MG per tablet   Oral   Take 1 tablet by mouth every 4 (four) hours as needed for pain.   12 tablet   0   . ibuprofen (ADVIL,MOTRIN) 600 MG tablet   Oral   Take 1 tablet (600 mg total) by mouth every 6 (six) hours as needed for pain.   30 tablet   0    Triage Vitals: BP 156/91  Pulse 84  Temp(Src) 98.1 F (36.7 C) (Oral)  Resp 20  Wt 165 lb (74.844 kg)  BMI 24.36 kg/m2  SpO2 98%  Physical Exam  Nursing note and vitals reviewed. Constitutional: He appears well-developed and well-nourished. No distress.  HENT:  Head: Normocephalic and atraumatic.  Eyes: Pupils are equal, round, and reactive to light.  Neck: Normal range of motion. Neck supple.  Cardiovascular: Normal rate and regular rhythm.   Pulmonary/Chest: Effort normal.  Abdominal: Soft.  Musculoskeletal:       Right hand: He exhibits tenderness, bony tenderness and swelling. He exhibits normal range of motion, normal two-point discrimination, normal capillary refill, no deformity and no laceration. Normal sensation noted. Normal strength noted.  Neurological: He is alert.  Skin: Skin is warm and dry.    ED Course  Procedures (including critical care time)  DIAGNOSTIC STUDIES: Oxygen Saturation is 98% on room air, normal by my interpretation.  COORDINATION OF CARE: 6:49 PM- Discussed placing an ACE bandage on the right hand and discharging the patient with an antiinflammatory medication and vicodin.  Advised the patient to take ibuprofen and ice the right hand for the next couple of days to reduce the swelling.  The patient agreed to the treatment plan.     Labs Review Labs Reviewed - No data to display Imaging Review Dg Hand Complete Right  01/01/2013   CLINICAL DATA:  Punched a wall a week ago  EXAM: RIGHT HAND - COMPLETE 3+ VIEW  COMPARISON:  None  FINDINGS: Three views of the right hand submitted. No acute fracture or subluxation. Degenerative changes interphalangeal joint of the  thumb . Degenerative changes distal interphalangeal joint 2nd and 3rd finger. No displaced fracture or subluxation. Mild narrowing of radiocarpal joint space. Diffuse soft tissue swelling noted metacarpal region.  IMPRESSION: No displaced fracture or subluxation. Degenerative changes as described above. Diffuse soft tissue swelling metacarpal region.   Electronically Signed   By: Natasha Mead M.D.   On: 01/01/2013 18:09    EKG Interpretation   None       MDM   1. Hand contusion, right, initial encounter      Referral to Ortho. 57 y.o.Shane Franklin's evaluation in the Emergency Department is complete. It has been determined that no acute conditions requiring further emergency intervention are present at this time. The patient/guardian have been advised of the diagnosis and plan. We have discussed signs and symptoms that warrant return to the ED, such as changes or worsening in symptoms.  Vital signs are stable at discharge. Filed Vitals:   01/01/13 1730  BP: 156/91  Pulse: 84  Temp: 98.1 F (36.7 C)  Resp: 20    Patient/guardian has voiced understanding and agreed to follow-up with the PCP or specialist.  I personally performed the services described in this documentation, which was scribed in my presence. The recorded information has been reviewed and is accurate.    Dorthula Matas, PA-C 01/01/13 1904  Dorthula Matas, PA-C 01/01/13 1906

## 2013-01-02 NOTE — ED Provider Notes (Signed)
Medical screening examination/treatment/procedure(s) were performed by non-physician practitioner and as supervising physician I was immediately available for consultation/collaboration.  EKG Interpretation   None         Treylon Henard M Briston Lax, MD 01/02/13 0007 

## 2014-10-28 ENCOUNTER — Encounter (HOSPITAL_COMMUNITY): Payer: Self-pay | Admitting: Nurse Practitioner

## 2014-10-28 ENCOUNTER — Emergency Department (HOSPITAL_COMMUNITY)
Admission: EM | Admit: 2014-10-28 | Discharge: 2014-10-28 | Disposition: A | Discharge status: Home / Self Care | Payer: Self-pay | Attending: Emergency Medicine | Admitting: Emergency Medicine

## 2014-10-28 DIAGNOSIS — M25511 Pain in right shoulder: Secondary | ICD-10-CM | POA: Insufficient documentation

## 2014-10-28 DIAGNOSIS — R011 Cardiac murmur, unspecified: Secondary | ICD-10-CM | POA: Insufficient documentation

## 2014-10-28 DIAGNOSIS — M545 Low back pain, unspecified: Secondary | ICD-10-CM

## 2014-10-28 DIAGNOSIS — K5900 Constipation, unspecified: Secondary | ICD-10-CM | POA: Insufficient documentation

## 2014-10-28 DIAGNOSIS — Z720 Tobacco use: Secondary | ICD-10-CM | POA: Insufficient documentation

## 2014-10-28 MED ORDER — METHOCARBAMOL 500 MG PO TABS: 1000.0000 mg | ORAL_TABLET | Freq: Four times a day (QID) | ORAL | Status: DC | PRN

## 2014-10-28 MED ORDER — METHOCARBAMOL 500 MG PO TABS
1000.0000 mg | ORAL_TABLET | Freq: Once | ORAL | Status: AC
  Administered 2014-10-28: 1000 mg via ORAL
  Filled 2014-10-28: qty 2

## 2014-10-28 NOTE — Discharge Instructions (Signed)
Drink plenty of water and do not take any NSAIDs (aspirin, Aleve, naproxen, Motrin, ibuprofen).  You can also take  tylenol (acetaminophen) 975mg  (this is 3 over the counter pills) four times a day. Do not drink alcohol or combine with other medications that have acetaminophen as an ingredient (Read the labels!).    For breakthrough pain you may take Robaxin. Do not drink alcohol, drive or operate heavy machinery when taking Robaxin.  Do not hesitate to return to the emergency room for any new, worsening or concerning symptoms.  Please obtain primary care using resource guide below. Let them know that you were seen in the emergency room and that they will need to obtain records for further outpatient management.  Please follow with your primary care doctor in the next 5 days for high blood pressure evaluation. If you do not have a primary care doctor, present to urgent care. Reduce salt intake. Seek emergency medical care for unilateral weakness, slurring, change in vision, or chest pain and shortness of breath.  Emergency Department Resource Guide 1) Find a Doctor and Pay Out of Pocket Although you won't have to find out who is covered by your insurance plan, it is a good idea to ask around and get recommendations. You will then need to call the office and see if the doctor you have chosen will accept you as a new patient and what types of options they offer for patients who are self-pay. Some doctors offer discounts or will set up payment plans for their patients who do not have insurance, but you will need to ask so you aren't surprised when you get to your appointment.  2) Contact Your Local Health Department Not all health departments have doctors that can see patients for sick visits, but many do, so it is worth a call to see if yours does. If you don't know where your local health department is, you can check in your phone book. The CDC also has a tool to help you locate your state's health  department, and many state websites also have listings of all of their local health departments.  3) Find a Walk-in Clinic If your illness is not likely to be very severe or complicated, you may want to try a walk in clinic. These are popping up all over the country in pharmacies, drugstores, and shopping centers. They're usually staffed by nurse practitioners or physician assistants that have been trained to treat common illnesses and complaints. They're usually fairly quick and inexpensive. However, if you have serious medical issues or chronic medical problems, these are probably not your best option.  No Primary Care Doctor: - Call Health Connect at  [phone] - they can help you locate a primary care doctor that  accepts your insurance, provides certain services, etc. - Physician Referral Service- [phone]  Chronic Pain Problems: Organization         Address  Phone   Notes  [facility]  [phone] Patients need to be referred by their primary care doctor.   Medication Assistance: Organization         Address  Phone   Notes  [facility] Medication [facility] [address] [place] [place], [place] [address] [phone] --Must be a resident of [facility] -- Must have NO insurance coverage whatsoever (no Medicaid/ Medicare, etc.) -- The pt. MUST have a primary care doctor that directs their care regularly and follows them in the community   MedAssist  (  [phone]   [employer]  [phone]    Agencies that provide inexpensive medical care: Organization         Address  Phone   Notes  [facility]  [phone]   [facility]    [phone]   [facility] [address] [place], [place] [address] [phone]   [facility] [address], [place] [phone]   Planned Parenthood    5752414459   [facility]    [phone]    [facility] and [facility]  [address], [place] Phone:  [phone], Fax:  [phone] Hours of Operation:  9 am - 6 pm, M-F.  Also accepts Medicaid/Medicare and self-pay.  [facility]  [address], [place] Phone: [phone], Fax: [phone]. Hours of Operation:  8:30 am - 5:30 pm, M-F.  Also accepts Medicaid and self-pay.  [facility] [place] [address] [place] Phone: [phone]   [facility] [address] Natasha Bence [place], [place] [phone], Ext. [phone] Mondays & Thursdays: 7-9 AM.  First 15 patients are seen on a first come, first serve basis.    Medicaid-accepting [facility] Providers:  Organization         Address  Phone   Notes  [facility] [address], [place] [phone] Also accepts self-pay patients.  [facility] [address] Laurell Josephs [place], [place]  [phone]   [facility] [address], [place] [phone]   [facility] [address], [place] [phone]   Renaye Rakers [address], [place]   [phone] Only accepts [place] Access [place] patients after they have their name applied to their card.   Self-Pay (no insurance) in [facility]:  Organization         Address  Phone   Notes  Sickle Cell Patients, [facility] [address] [place], [place] [phone]   [facility] [address] [place], [place] [phone]   [facility] [place]  [address], [place] [phone]   [facility]/Dr. Osei-Bonsu  [address], [place] or [address], [place] [phone] Phone number for both [place] and [place] locations is the same.  [facility]gent [facility] and [facility] [address] [place] [phone]    [facility] [address] [place] or [address] [phone]   [facility] [address] [place] [phone], phone; [phone], fax Sees patients 1st and 3rd Saturday of every month.  Must not qualify for public or private insurance (i.e. Medicaid, Medicare, [place] Health Choice, Veterans' Benefits)  Household income should be no more than 200% of the poverty level The clinic cannot treat you if you are pregnant or think you are pregnant  Sexually transmitted diseases are not treated at the clinic.    Dental Care: Organization         Address  Phone  Notes  [facility] Department of [facility] [address] [place], [place] [phone] Accepts children up to age 82 who are enrolled in [place] or [place] Health Choice; pregnant women  with a Medicaid card; and children who have applied for Medicaid or [place] Health Choice, but were declined, whose parents can pay a reduced fee at time of service.  [facility] Department of [facility]  [address], [place] [phone] Accepts children up to age 74 who are enrolled in [place] or [place] Health Choice; pregnant women with a Medicaid card; and children who have applied for Medicaid or [place] Health Choice, but were declined, whose parents can pay a reduced fee at time of service.  [facility]  [address] [place], [place] [phone] Patients are seen by appointment only. Walk-ins are not accepted. [facility] will see patients 56 years of age and older. Monday - Tuesday (8am-5pm) Most Wednesdays (8:30-5pm) $30 per visit, cash only  [facility] Access [facility]  [address], [facility] [phone] Patients are seen by appointment only. Walk-ins are not accepted. [facility] will see patients 76 years of age and older. One Wednesday Evening (Monthly: Volunteer Based).   $30 per visit, cash only  [employer]  [phone] for adults; Children under age 29, call Graduate Pediatric Dentistry at [phone]. Children aged 77-14, please call [phone] to request a pediatric application.  Dental services are provided in all areas of dental care including fillings, crowns and bridges, complete and partial dentures, implants, gum treatment, root canals, and extractions. Preventive care is also provided. Treatment is provided to both adults and children. Patients are selected via a lottery and there is often a waiting list.   [facility] [address] [place]  [phone] [email or web address]   [facility] [address] [place], [place] [phone], Ext. [phone] Second and Fourth Thursday of each month, opens at 6:30 AM; Clinic ends at 9 AM.  Patients are seen on a first-come first-served basis, and a limited number are seen during each clinic.   [facility]  [address] Ether Griffins [place], [place] [phone]   Eligibility Requirements You must have lived in [place], [place], or [place] counties for at least the last three months.   You cannot be eligible for state or federal sponsored [place], including [employer], [place], or [employer].   You generally cannot be eligible for healthcare insurance through your employer.    How to apply: Eligibility screenings are held every Tuesday and Wednesday afternoon from 1:00 pm until 4:00 pm. You do not need an appointment for the interview!  [facility] [address] [place], [place] [phone]   [facility] Health Department  [phone]   [facility] Health Department  [phone]   [facility] Health Department  [phone]    Behavioral Health Resources in the Community: Intensive Outpatient Programs Organization         Address  Phone  Notes  [facility] [address], [place], [place] [phone]   [facility] Outpatient [address], [place], [place] [phone]   [facility] [address], [place], [place]  [phone]   [facility] [address]  [place], [place] [phone] or [phone]   Substance Abuse Resources Organization         Address  Phone  Notes  [facility] Drug Services  [phone]   [facility]  [phone]   [facility] [place]  [phone]   [facility]  [phone]   Residential & Outpatient Substance Abuse Program  [phone]   Psychological Services Organization         Address  Phone  Notes  [facility] Ethel  [place]  [phone]   [place] [address] [place] or [phone]    Mobile Crisis Teams Organization         Address  Phone  Notes  [facility], Mobile Crisis Care Unit  [phone]   [facility]  [address] [place], [place]   [place] [facility] [address] [place] [phone]    Self-Help/Support Groups Organization         Address  Phone             Notes  [place]. of [place] - variety of support groups  [facility] Call for more information  Narcotics Anonymous (NA), [facility] [address], [employer] [place]  2 meetings at this location   Special educational needs teacher         Address  Phone  Notes  [facility] [place],    [place]  [phone]   [facility]  [address] [place] [address], [place], [place]   [place], [place] [phone] Admissions: 8am-3pm M-F  Incentives Substance [place] [address],    [place], [place] [phone]   [facility] [address] [place], [place], [place]   [facility] [address],  [place], [place]   [facility] - Intensive Outpatient [address] Kristeen [address], [place], [place]   [facility] ([place] [address]  [place], [place] [phone] or [phone]   [facility] ([facility]) [address] [place], [place] Accepts Medicaid  [facility] [place] [address],  [place] [phone] Substance Abuse/Addiction Treatment   [facility] Organization         Address  Phone  Notes  [facility]  [phone]   Domenic Schwab, PhD [address] Arlis Porta [place], [place]   [phone] or [phone]   [facility] [place], [place] [phone]   [facility] [address] [place], [place] [phone] Insurance/Medicaid/sponsorship through [facility] and Families [address], [address] [place]                                    [place], [place] [phone] [place] [address], [place] [phone]    Dr. Adele Schilder  [phone]   [facility] Dept. 1) [address], [place] 2) [place] 3)  [address], [place] [phone]  [phone]   [place] [phone] or [phone] (After Hours)

## 2014-10-28 NOTE — ED Notes (Addendum)
He c/o lower back pain radiating down both legs x 1 week. Started after waking to get out of bed one morning. He also c/o constipation x 3 weeks, tried OTC laxatives with no relief. States the back pain seems worse when he is constipated. hes also had a headache for 3 days. He tried OTC tylenol arthritis with no rleief

## 2014-10-28 NOTE — ED Provider Notes (Signed)
CSN: [id number]     Arrival date & time 10/28/14  1452 History  This chart was scribed for Shane Emery, PA-C, working with Shane Mocha, MD by Shane Franklin, ED Scribe. This patient was seen in room TR02C/TR02C and the patient's care was started at 4:36 PM.   Chief Complaint  Patient presents with  . Back Pain   The history is provided by the patient. No language interpreter was used.   HPI Comments: Shane Franklin is a 59 y.o. male who presents to the Emergency Department complaining of new, constant, moderate, unchanged lower back pain with "spasming" radiation down the bilateral legs onset two weeks ago. He has taken 3-4 Aleves per dose since onset and Tylenol with relief (most recently 2 Aleve this morning and 2 Tylenol PTA).  He is able to ambulate normal to baseline.  He denies hx of CA, IVDU.  He denies abdominal pain, n/v, fever, chills.    He also complains of a throbbing, resolved headache onset 3 days ago lasting two days.  The day prior to onset, the patient was working in an attic and hit his head multiple times in multiple locations on the rafters.  He denies use of anti-coagulants.  He denies visible injury, LOC, weakness.   He also complains of intermittent constipation (2-3 days without bowel movement) the past several months relieved by OTC laxatives.    Past Medical History  Diagnosis Date  . Murmur    History reviewed. No pertinent past surgical history. History reviewed. No pertinent family history. Social History  Substance Use Topics  . Smoking status: Current Some Day Smoker -- 1.00 packs/day  . Smokeless tobacco: None  . Alcohol Use: 0.6 oz/week    1 Shots of liquor per week     Comment: every other day either wine cooler or liquor    Review of Systems  Gastrointestinal: Positive for constipation.  Musculoskeletal: Positive for myalgias and back pain.  All other systems reviewed and are negative.     Allergies  Review of patient's allergies indicates  no known allergies.  Home Medications   Prior to Admission medications   Medication Sig Start Date End Date Taking? Authorizing Provider  HYDROcodone-acetaminophen (NORCO/VICODIN) 5-325 MG per tablet Take 1 tablet by mouth every 4 (four) hours as needed for pain. 01/01/13   Shane Neva Seat, PA-C  ibuprofen (ADVIL,MOTRIN) 600 MG tablet Take 1 tablet (600 mg total) by mouth every 6 (six) hours as needed for pain. 01/01/13   Shane Pel, PA-C  naproxen sodium (ALEVE) 220 MG tablet Take 220 mg by mouth 2 (two) times daily as needed (pain).    Historical Provider, MD   BP 164/91 mmHg  Pulse 75  Temp(Src) 98 F (36.7 C) (Oral)  Resp 16  SpO2 97% Physical Exam  Constitutional: He is oriented to person, place, and time. He appears well-developed and well-nourished. No distress.  HENT:  Head: Normocephalic and atraumatic.  Mouth/Throat: Oropharynx is clear and moist.  Eyes: Conjunctivae and EOM are normal. Pupils are equal, round, and reactive to light.  No TTP of maxillary or frontal sinuses  No TTP or induration of temporal arteries bilaterally  Neck: Normal range of motion. Neck supple. No tracheal deviation present.  FROM to C-spine. Pt can touch chin to chest without discomfort. No TTP of midline cervical spine.   Cardiovascular: Normal rate, regular rhythm and intact distal pulses.   Pulmonary/Chest: Effort normal and breath sounds normal. No respiratory distress. He has no wheezes. He has  no rales. He exhibits no tenderness.  Abdominal: Soft. Bowel sounds are normal. There is no tenderness.  Musculoskeletal: Normal range of motion. He exhibits tenderness. He exhibits no edema.       Arms: Right shoulder:  Shoulder with no deformity. FROM to shoulder and elbow. No TTP of rotator cuff musculature. Drop arm negative. Neurovascularly intact   Neurological: He is alert and oriented to person, place, and time. No cranial nerve deficit.  No point tenderness to percussion of lumbar  spinal processes.  No TTP or paraspinal muscular spasm. Strength is 5 out of 5 to bilateral lower extremities at hip and knee; extensor hallucis longus 5 out of 5. Ankle strength 5 out of 5, no clonus, neurovascularly intact. No saddle anaesthesia. Patellar reflexes are 2+ bilaterally.       Skin: Skin is warm and dry. He is not diaphoretic.  Psychiatric: He has a normal mood and affect. His behavior is normal.  Nursing note and vitals reviewed.   ED Course  Procedures (including critical care time)  DIAGNOSTIC STUDIES: Oxygen Saturation is 97% on RA, normal by my interpretation.    COORDINATION OF CARE:  4:45 PM Discussed treatment plan with patient at bedside.  Patient acknowledges and agrees with plan.    Labs Review Labs Reviewed - No data to display  Imaging Review No results found. I have personally reviewed and evaluated these images and lab results as part of my medical decision-making.   EKG Interpretation None      MDM   Final diagnoses:  Midline low back pain without sciatica    Filed Vitals:   10/28/14 1512 10/28/14 1653  BP: 164/91 169/85  Pulse: 75 61  Temp: 98 F (36.7 C) 97.5 F (36.4 C)  TempSrc: Oral Oral  Resp: 16 14  SpO2: 97% 100%    Medications  methocarbamol (ROBAXIN) tablet 1,000 mg (1,000 mg Oral Given 10/28/14 1649)    Shane Franklin is a pleasant 59 y.o. male presenting with right shoulder and low back pain.  No neurological deficits and normal neuro exam.  Patient can walk but states is painful.  No loss of bowel or bladder control.  No concern for cauda equina.  No fever, night sweats, weight loss, h/o cancer, IVDU.  RICE protocol and pain medicine indicated and discussed with patient.  Patient also reports a severe headache which resolved. This is a posttraumatic headache, he is not anticoagulated. Patient's neuro exam today is nonfocal. No indication for neuroimaging at this time.  Evaluation does not show pathology that would  require ongoing emergent intervention or inpatient treatment. Pt is hemodynamically stable and mentating appropriately. Discussed findings and plan with patient/guardian, who agrees with care plan. All questions answered. Return precautions discussed and outpatient follow up given.   Discharge Medication List as of 10/28/2014  4:47 PM    START taking these medications   Details  methocarbamol (ROBAXIN) 500 MG tablet Take 2 tablets (1,000 mg total) by mouth 4 (four) times daily as needed (Pain)., Starting 10/28/2014, Until Discontinued, Print         I personally performed the services described in this documentation, which was scribed in my presence. The recorded information has been reviewed and is accurate.   Shane Emery, PA-C 10/28/14 1736  Blane Ohara, MD 10/29/14 [phone]

## 2014-12-22 ENCOUNTER — Encounter: Payer: Self-pay | Admitting: General Practice

## 2014-12-22 ENCOUNTER — Ambulatory Visit: Payer: Self-pay | Admitting: Internal Medicine

## 2016-02-23 ENCOUNTER — Emergency Department (HOSPITAL_COMMUNITY)
Admission: EM | Admit: 2016-02-23 | Discharge: 2016-02-23 | Disposition: A | Discharge status: Home / Self Care | Payer: Self-pay | Attending: Emergency Medicine | Admitting: Emergency Medicine

## 2016-02-23 ENCOUNTER — Encounter (HOSPITAL_COMMUNITY): Payer: Self-pay | Admitting: Emergency Medicine

## 2016-02-23 ENCOUNTER — Emergency Department (HOSPITAL_COMMUNITY): Payer: Self-pay

## 2016-02-23 DIAGNOSIS — Z8673 Personal history of transient ischemic attack (TIA), and cerebral infarction without residual deficits: Secondary | ICD-10-CM | POA: Insufficient documentation

## 2016-02-23 DIAGNOSIS — F17200 Nicotine dependence, unspecified, uncomplicated: Secondary | ICD-10-CM | POA: Insufficient documentation

## 2016-02-23 DIAGNOSIS — M25511 Pain in right shoulder: Secondary | ICD-10-CM | POA: Insufficient documentation

## 2016-02-23 DIAGNOSIS — I10 Essential (primary) hypertension: Secondary | ICD-10-CM | POA: Insufficient documentation

## 2016-02-23 LAB — BASIC METABOLIC PANEL
Anion gap: 9 (ref 5–15)
BUN: 14 mg/dL (ref 6–20)
CHLORIDE: 104 mmol/L (ref 101–111)
CO2: 24 mmol/L (ref 22–32)
CREATININE: 1.08 mg/dL (ref 0.61–1.24)
Calcium: 9.1 mg/dL (ref 8.9–10.3)
GFR calc Af Amer: >60 mL/min (ref >60)
GFR calc non Af Amer: >60 mL/min (ref >60)
Glucose, Bld: 98 mg/dL (ref 65–99)
POTASSIUM: 3.6 mmol/L (ref 3.5–5.1)
Sodium: 137 mmol/L (ref 135–145)

## 2016-02-23 LAB — CBC
HCT: 40.1 % (ref 39.0–52.0)
Hemoglobin: 13.9 g/dL (ref 13.0–17.0)
MCH: 30.9 pg (ref 26.0–34.0)
MCHC: 34.7 g/dL (ref 30.0–36.0)
MCV: 89.1 fL (ref 78.0–100.0)
Platelets: 182 10*3/uL (ref 150–400)
RBC: 4.5 MIL/uL (ref 4.22–5.81)
RDW: 13.4 % (ref 11.5–15.5)
WBC: 6.6 10*3/uL (ref 4.0–10.5)

## 2016-02-23 LAB — TROPONIN I: Troponin I: <0.03 ng/mL (ref <0.03)

## 2016-02-23 MED ORDER — ONDANSETRON HCL 4 MG/2ML IJ SOLN
4.0000 mg | Freq: Once | INTRAMUSCULAR | Status: AC
  Administered 2016-02-23: 4 mg via INTRAVENOUS
  Filled 2016-02-23: qty 2

## 2016-02-23 MED ORDER — SODIUM CHLORIDE 0.9 % IV SOLN: INTRAVENOUS | Status: DC

## 2016-02-23 MED ORDER — MORPHINE SULFATE (PF) 4 MG/ML IV SOLN
4.0000 mg | Freq: Once | INTRAVENOUS | Status: AC
  Administered 2016-02-23: 4 mg via INTRAVENOUS
  Filled 2016-02-23: qty 1

## 2016-02-23 MED ORDER — TRAMADOL HCL 50 MG PO TABS: 50.0000 mg | ORAL_TABLET | Freq: Four times a day (QID) | ORAL | 0 refills | Status: DC | PRN

## 2016-02-23 NOTE — ED Notes (Signed)
Pt verbalizes understanding of discharge instructions. A/o x4. Wheeled to waiting room to await family member to transport patient home.

## 2016-02-23 NOTE — ED Notes (Signed)
EMS reports t wave inversion on 12 lead with rate in the 50's.

## 2016-02-23 NOTE — ED Triage Notes (Signed)
Pt to ER by [facility] from home with complaint of right shoulder pain x 1 week. EMS reports on arrival to patient home patient was diaphoretic with BP 90 palpated. Pt denies chest pain or shortness of breath. Denies trauma or injury to arm. Reports daily crack use as well as daily drinker. Pt is a/o x4. Reports pain is worse with movement. Tender on palpation.

## 2016-02-23 NOTE — ED Provider Notes (Signed)
[facility]-EMERGENCY DEPT Provider Note   CSN: [id number] Arrival date & time: 02/23/16  0810     History   Chief Complaint Chief Complaint  Patient presents with  . Shoulder Pain    HPI Shane Franklin is a 60 y.o. male.  Patient c/o right shoulder pain for the past 1-2 weeks. Pain constant, dull, severe, persistent, worse w moving of right shoulder/arm. Denies injury. No hx chr shoulder problems. Denies fever or chills. No numbness/tingling down arm. No neck pain. Denies chest pain or sob.   Denies other body/joint pain.     The history is provided by the patient.    Past Medical History:  Diagnosis Date  . Murmur     Patient Active Problem List   Diagnosis Date Noted  . Weakness of one side of body 08/18/2011  . CVA (cerebrovascular accident) (HCC) 08/18/2011  . Polysubstance abuse 08/18/2011  . Elevated BP 08/18/2011    History reviewed. No pertinent surgical history.     Home Medications    Prior to Admission medications   Medication Sig Start Date End Date Taking? Authorizing Provider  HYDROcodone-acetaminophen (NORCO/VICODIN) 5-325 MG per tablet Take 1 tablet by mouth every 4 (four) hours as needed for pain. 01/01/13   Tiffany Neva Seat, PA-C  ibuprofen (ADVIL,MOTRIN) 600 MG tablet Take 1 tablet (600 mg total) by mouth every 6 (six) hours as needed for pain. 01/01/13   Tiffany Neva Seat, PA-C  methocarbamol (ROBAXIN) 500 MG tablet Take 2 tablets (1,000 mg total) by mouth 4 (four) times daily as needed (Pain). 10/28/14   Nicole Pisciotta, PA-C  naproxen sodium (ALEVE) 220 MG tablet Take 220 mg by mouth 2 (two) times daily as needed (pain).    Historical Provider, MD    Family History History reviewed. No pertinent family history.  Social History Social History  Substance Use Topics  . Smoking status: Current Some Day Smoker    Packs/day: 1.00  . Smokeless tobacco: Never Used  . Alcohol use 0.6 oz/week    1 Shots of liquor per week     Comment: every other day  either wine cooler or liquor     Allergies   Patient has no known allergies.   Review of Systems Review of Systems  Constitutional: Negative for chills and fever.  HENT: Negative for sore throat.   Eyes: Negative for redness.  Respiratory: Negative for cough and shortness of breath.   Cardiovascular: Negative for chest pain.  Gastrointestinal: Negative for abdominal pain and vomiting.  Genitourinary: Negative for flank pain.  Musculoskeletal: Negative for back pain and neck pain.  Skin: Negative for rash.  Neurological: Negative for weakness, numbness and headaches.  Hematological: Does not bruise/bleed easily.  Psychiatric/Behavioral: Negative for confusion.     Physical Exam Updated Vital Signs There were no vitals taken for this visit.  Physical Exam  Constitutional: He is oriented to person, place, and time. He appears well-developed and well-nourished. No distress.  HENT:  Mouth/Throat: Oropharynx is clear and moist.  Eyes: Conjunctivae are normal.  Neck: Neck supple. No tracheal deviation present.  Cardiovascular: Normal rate, regular rhythm, normal heart sounds and intact distal pulses.  Exam reveals no gallop and no friction rub.   No murmur heard. Pulmonary/Chest: Effort normal and breath sounds normal. No accessory muscle usage. No respiratory distress.  Abdominal: Soft. Bowel sounds are normal. He exhibits no distension. There is no tenderness.  Musculoskeletal: He exhibits no edema.  Tenderness right shoulder, and pain w active rom right shoulder. No  swelling noted. No erythema or increased warmth. Radial pulse 2+.   Neurological: He is alert and oriented to person, place, and time.  RUE motor 5/5, sens intact.   Skin: Skin is warm and dry. No rash noted. He is not diaphoretic. No erythema.  Psychiatric: He has a normal mood and affect.  Nursing note and vitals reviewed.    ED Treatments / Results  Labs (all labs ordered are listed, but only abnormal  results are displayed)  Results for orders placed or performed during the hospital encounter of 02/23/16  Basic metabolic panel  Result Value Ref Range   Sodium 137 135 - 145 mmol/L   Potassium 3.6 3.5 - 5.1 mmol/L   Chloride 104 101 - 111 mmol/L   CO2 24 22 - 32 mmol/L   Glucose, Bld 98 65 - 99 mg/dL   BUN 14 6 - 20 mg/dL   Creatinine, Ser 4.131.08 0.61 - 1.24 mg/dL   Calcium 9.1 8.9 - 24.410.3 mg/dL   GFR calc non Af Amer >60 >60 mL/min   GFR calc Af Amer >60 >60 mL/min   Anion gap 9 5 - 15  CBC  Result Value Ref Range   WBC 6.6 4.0 - 10.5 K/uL   RBC 4.50 4.22 - 5.81 MIL/uL   Hemoglobin 13.9 13.0 - 17.0 g/dL   HCT 01.040.1 27.239.0 - 53.652.0 %   MCV 89.1 78.0 - 100.0 fL   MCH 30.9 26.0 - 34.0 pg   MCHC 34.7 30.0 - 36.0 g/dL   RDW 64.413.4 03.411.5 - 74.215.5 %   Platelets 182 150 - 400 K/uL  Troponin I  Result Value Ref Range   Troponin I <0.03 <0.03 ng/mL   Dg Shoulder Right  Result Date: 02/23/2016 CLINICAL DATA:  All right shoulder pain EXAM: RIGHT SHOULDER - 2+ VIEW COMPARISON:  None. FINDINGS: There is no fracture or dislocation. There are mild degenerative changes of the acromioclavicular joint. IMPRESSION: No acute osseous injury of the right shoulder. Electronically Signed   By: Elige KoHetal  Patel   On: 02/23/2016 09:22    EKG  EKG Interpretation None       Radiology Dg Shoulder Right  Result Date: 02/23/2016 CLINICAL DATA:  All right shoulder pain EXAM: RIGHT SHOULDER - 2+ VIEW COMPARISON:  None. FINDINGS: There is no fracture or dislocation. There are mild degenerative changes of the acromioclavicular joint. IMPRESSION: No acute osseous injury of the right shoulder. Electronically Signed   By: Elige KoHetal  Patel   On: 02/23/2016 09:22    Procedures Procedures (including critical care time)  Medications Ordered in ED Medications  0.9 %  sodium chloride infusion (not administered)  morphine 4 MG/ML injection 4 mg (not administered)  ondansetron (ZOFRAN) injection 4 mg (not administered)      Initial Impression / Assessment and Plan / ED Course  I have reviewed the triage vital signs and the nursing notes.  Pertinent labs & imaging results that were available during my care of the patient were reviewed by me and considered in my medical decision making (see chart for details).  Clinical Course     Iv ns. Morphine iv. zofran iv. Labs. Xr.   Reviewed nursing notes and prior charts for additional history.   Recheck pt comfortable. No distress.   ?rotator cuff related pain ?tendonitis. ?djd/arthritis.   Currently no findings of septic joint on exam, minimal discomfort w passive rom. No fever.    Patient currently appears stable for d/c.   rec close outpt pcp  f/u.   Final Clinical Impressions(s) / ED Diagnoses   Final diagnoses:  None    New Prescriptions New Prescriptions   No medications on file     Cathren LaineKevin Devaun Hernandez, MD 02/23/16 1009

## 2016-02-23 NOTE — Discharge Instructions (Signed)
It was our pleasure to provide your ER care today - we hope that you feel better.  Take motrin or aleve as need for pain (available over the counter).  You may also take ultram as need for pain - no driving when taking.  Your blood pressure is high today - follow up with primary care doctor for both your shoulder pain, as well as for recheck of blood pressure in the coming week - see referral - call office today to arrange appointment.   Return to ER right away if worse, new symptoms, fevers, redness/swelling to shoulder, worsening or intractable pain, other concern.   You were given pain medication in the ER - no driving for the next 4 hours.

## 2016-03-28 ENCOUNTER — Encounter (HOSPITAL_COMMUNITY): Payer: Self-pay | Admitting: Emergency Medicine

## 2016-03-28 ENCOUNTER — Emergency Department (HOSPITAL_COMMUNITY)
Admission: EM | Admit: 2016-03-28 | Discharge: 2016-03-28 | Disposition: A | Discharge status: Home / Self Care | Payer: Self-pay | Attending: Emergency Medicine | Admitting: Emergency Medicine

## 2016-03-28 ENCOUNTER — Emergency Department (HOSPITAL_COMMUNITY): Payer: Self-pay

## 2016-03-28 DIAGNOSIS — Z79899 Other long term (current) drug therapy: Secondary | ICD-10-CM | POA: Insufficient documentation

## 2016-03-28 DIAGNOSIS — F17200 Nicotine dependence, unspecified, uncomplicated: Secondary | ICD-10-CM | POA: Insufficient documentation

## 2016-03-28 DIAGNOSIS — R030 Elevated blood-pressure reading, without diagnosis of hypertension: Secondary | ICD-10-CM | POA: Insufficient documentation

## 2016-03-28 DIAGNOSIS — Y999 Unspecified external cause status: Secondary | ICD-10-CM | POA: Insufficient documentation

## 2016-03-28 DIAGNOSIS — Z8673 Personal history of transient ischemic attack (TIA), and cerebral infarction without residual deficits: Secondary | ICD-10-CM | POA: Insufficient documentation

## 2016-03-28 DIAGNOSIS — S0181XA Laceration without foreign body of other part of head, initial encounter: Secondary | ICD-10-CM | POA: Insufficient documentation

## 2016-03-28 DIAGNOSIS — Y939 Activity, unspecified: Secondary | ICD-10-CM | POA: Insufficient documentation

## 2016-03-28 DIAGNOSIS — Z23 Encounter for immunization: Secondary | ICD-10-CM | POA: Insufficient documentation

## 2016-03-28 DIAGNOSIS — Y929 Unspecified place or not applicable: Secondary | ICD-10-CM | POA: Insufficient documentation

## 2016-03-28 MED ORDER — TETANUS-DIPHTH-ACELL PERTUSSIS 5-2.5-18.5 LF-MCG/0.5 IM SUSP
0.5000 mL | Freq: Once | INTRAMUSCULAR | Status: AC
  Administered 2016-03-28: 0.5 mL via INTRAMUSCULAR
  Filled 2016-03-28: qty 0.5

## 2016-03-28 MED ORDER — BACITRACIN ZINC 500 UNIT/GM EX OINT
1.0000 "application " | TOPICAL_OINTMENT | Freq: Once | CUTANEOUS | Status: AC
  Administered 2016-03-28: 1 via TOPICAL
  Filled 2016-03-28: qty 0.9

## 2016-03-28 MED ORDER — HYDROCODONE-ACETAMINOPHEN 5-325 MG PO TABS: ORAL_TABLET | ORAL | 0 refills | Status: DC

## 2016-03-28 MED ORDER — HYDROCODONE-ACETAMINOPHEN 5-325 MG PO TABS
1.0000 | ORAL_TABLET | Freq: Once | ORAL | Status: AC
  Administered 2016-03-28: 1 via ORAL
  Filled 2016-03-28: qty 1

## 2016-03-28 NOTE — ED Notes (Signed)
Pt transported to CT ?

## 2016-03-28 NOTE — Discharge Instructions (Signed)
Take vicodin for breakthrough pain, do not drink alcohol, drive, care for children or do other critical tasks while taking vicodin.  Please follow with your primary care doctor in the next 5 days for high blood pressure evaluation. If you do not have a primary care doctor, present to urgent care. Reduce salt intake. Seek emergency medical care for unilateral weakness, slurring, change in vision, or chest pain and shortness of breath.  Do not hesitate to return to the emergency room for any new, worsening or concerning symptoms.  Please obtain primary care using resource guide below. Let them know that you were seen in the emergency room and that they will need to obtain records for further outpatient management.
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
## 2016-03-28 NOTE — ED Notes (Signed)
Using clean technique, cleansed facial wound with dermal wound cleaner. Applied BACITRACIN ointment to the area. Pt tolerated it well. Instructed patient on how to care wound at home.

## 2016-11-06 ENCOUNTER — Emergency Department (HOSPITAL_COMMUNITY): Payer: Self-pay

## 2016-11-06 ENCOUNTER — Emergency Department (HOSPITAL_COMMUNITY)
Admission: EM | Admit: 2016-11-06 | Discharge: 2016-11-06 | Disposition: A | Discharge status: Home / Self Care | Payer: Self-pay | Attending: Emergency Medicine | Admitting: Emergency Medicine

## 2016-11-06 ENCOUNTER — Encounter (HOSPITAL_COMMUNITY): Payer: Self-pay | Admitting: Emergency Medicine

## 2016-11-06 DIAGNOSIS — K5900 Constipation, unspecified: Secondary | ICD-10-CM

## 2016-11-06 DIAGNOSIS — M5442 Lumbago with sciatica, left side: Secondary | ICD-10-CM | POA: Insufficient documentation

## 2016-11-06 DIAGNOSIS — Z8673 Personal history of transient ischemic attack (TIA), and cerebral infarction without residual deficits: Secondary | ICD-10-CM | POA: Insufficient documentation

## 2016-11-06 DIAGNOSIS — K5909 Other constipation: Secondary | ICD-10-CM | POA: Insufficient documentation

## 2016-11-06 DIAGNOSIS — F17200 Nicotine dependence, unspecified, uncomplicated: Secondary | ICD-10-CM | POA: Insufficient documentation

## 2016-11-06 DIAGNOSIS — R011 Cardiac murmur, unspecified: Secondary | ICD-10-CM | POA: Insufficient documentation

## 2016-11-06 LAB — URINALYSIS, ROUTINE W REFLEX MICROSCOPIC
Bilirubin Urine: NEGATIVE
Glucose, UA: NEGATIVE mg/dL
Hgb urine dipstick: NEGATIVE
Ketones, ur: NEGATIVE mg/dL
Leukocytes, UA: NEGATIVE
NITRITE: NEGATIVE
Protein, ur: NEGATIVE mg/dL
SPECIFIC GRAVITY, URINE: 1.014 (ref 1.005–1.030)
pH: 6 (ref 5.0–8.0)

## 2016-11-06 LAB — COMPREHENSIVE METABOLIC PANEL
ALBUMIN: 4.2 g/dL (ref 3.5–5.0)
ALK PHOS: 69 U/L (ref 38–126)
ALT: 17 U/L (ref 17–63)
AST: 23 U/L (ref 15–41)
Anion gap: 8 (ref 5–15)
BILIRUBIN TOTAL: 0.9 mg/dL (ref 0.3–1.2)
BUN: 16 mg/dL (ref 6–20)
CALCIUM: 9.3 mg/dL (ref 8.9–10.3)
CO2: 25 mmol/L (ref 22–32)
Chloride: 103 mmol/L (ref 101–111)
Creatinine, Ser: 1.15 mg/dL (ref 0.61–1.24)
GFR calc non Af Amer: >60 mL/min (ref >60)
GLUCOSE: 96 mg/dL (ref 65–99)
POTASSIUM: 4.5 mmol/L (ref 3.5–5.1)
Sodium: 136 mmol/L (ref 135–145)
Total Protein: 7.7 g/dL (ref 6.5–8.1)

## 2016-11-06 LAB — CBC WITH DIFFERENTIAL/PLATELET
BASOS ABS: 0 10*3/uL (ref 0.0–0.1)
BASOS PCT: 0 %
Eosinophils Absolute: 0.1 10*3/uL (ref 0.0–0.7)
Eosinophils Relative: 2 %
HEMATOCRIT: 44.9 % (ref 39.0–52.0)
HEMOGLOBIN: 15 g/dL (ref 13.0–17.0)
LYMPHS PCT: 40 %
Lymphs Abs: 1.8 10*3/uL (ref 0.7–4.0)
MCH: 30.9 pg (ref 26.0–34.0)
MCHC: 33.4 g/dL (ref 30.0–36.0)
MCV: 92.4 fL (ref 78.0–100.0)
MONO ABS: 0.5 10*3/uL (ref 0.1–1.0)
Monocytes Relative: 11 %
NEUTROS PCT: 47 %
Neutro Abs: 2.1 10*3/uL (ref 1.7–7.7)
Platelets: 207 10*3/uL (ref 150–400)
RBC: 4.86 MIL/uL (ref 4.22–5.81)
RDW: 14.1 % (ref 11.5–15.5)
WBC: 4.5 10*3/uL (ref 4.0–10.5)

## 2016-11-06 LAB — LIPASE, BLOOD: Lipase: 22 U/L (ref 11–51)

## 2016-11-06 MED ORDER — ONDANSETRON HCL 4 MG/2ML IJ SOLN
4.0000 mg | Freq: Once | INTRAMUSCULAR | Status: AC
  Administered 2016-11-06: 4 mg via INTRAVENOUS
  Filled 2016-11-06: qty 2

## 2016-11-06 MED ORDER — KETOROLAC TROMETHAMINE 30 MG/ML IJ SOLN
15.0000 mg | Freq: Once | INTRAMUSCULAR | Status: AC
  Administered 2016-11-06: 15 mg via INTRAVENOUS
  Filled 2016-11-06: qty 1

## 2016-11-06 MED ORDER — NAPROXEN 375 MG PO TABS: 375.0000 mg | ORAL_TABLET | Freq: Two times a day (BID) | ORAL | 0 refills | Status: DC

## 2016-11-06 MED ORDER — MORPHINE SULFATE (PF) 4 MG/ML IV SOLN
4.0000 mg | Freq: Once | INTRAVENOUS | Status: AC
  Administered 2016-11-06: 4 mg via INTRAVENOUS
  Filled 2016-11-06: qty 1

## 2016-11-06 MED ORDER — CYCLOBENZAPRINE HCL 10 MG PO TABS: 10.0000 mg | ORAL_TABLET | Freq: Three times a day (TID) | ORAL | 0 refills | Status: DC | PRN

## 2016-11-06 MED ORDER — IOPAMIDOL (ISOVUE-300) INJECTION 61%
INTRAVENOUS | Status: AC
  Administered 2016-11-06: 100 mL
  Filled 2016-11-06: qty 100

## 2016-11-06 MED ORDER — CYCLOBENZAPRINE HCL 10 MG PO TABS
5.0000 mg | ORAL_TABLET | Freq: Once | ORAL | Status: AC
  Administered 2016-11-06: 5 mg via ORAL
  Filled 2016-11-06: qty 1

## 2016-11-06 NOTE — Discharge Instructions (Signed)
Your lab work and imaging has been reassuring. Her prostate is mildly enlarged. May need a follow-up with urology to continue having urgency and frequency. No signs of infection and urine urine. Her back pain is likely secondary to muscle strain. Would encourage warm compresses, lidocaine patches, muscle relaxers and NSAIDs.  Please take the Naproxen as prescribed for pain. Do not take any additional NSAIDs including Motrin, Aleve, Ibuprofen, Advil.  Please the the Flexeril for muscle relaxation. This medication will make you drowsy so avoid situation that could place you in danger.   Avoid heavy lifting until the acute pain passes. Wear a back brace. Performed back stretches.  Workup has been normal. Please take medications as prescribed and instructed.  SEEK IMMEDIATE MEDICAL ATTENTION IF: New numbness, tingling, weakness, or problem with the use of your arms or legs.  Severe back pain not relieved with medications.  Change in bowel or bladder control.  Urinary retention.  Numbness in your groin.  Increasing pain in any areas of the body (such as chest or abdominal pain).  Shortness of breath, dizziness or fainting.  Nausea (feeling sick to your stomach), vomiting, fever, or sweats.   One cap of Miralax mixed in a glass of water 2-4 times daily until bowel movements are regular. An over the counter stool softener such as Colace or Senokat S can also be used.  Follow up with your primary physician in regard's to today's visit. Return to ER for vomiting, new or worsening symptoms, any additional concerns.   GETTING TO GOOD BOWEL HEALTH.     The goal: ONE SOFT BOWEL MOVEMENT A DAY!  To have soft, regular bowel movements:  Drink at least 8 tall glasses of water a day.   Take plenty of fiber.  Fiber is the undigested part of plant food that passes into the colon, acting s ?natures broom? to encourage bowel motility and movement.  Fiber can absorb and hold large amounts of water. This results  in a larger, bulkier stool, which is soft and easier to pass. Work gradually over several weeks up to 6 servings a day of fiber (25g a day even more if needed) in the form of: Vegetables -- Root (potatoes, carrots, turnips), leafy green (lettuce, salad greens, celery, spinach), or cooked high residue (cabbage, broccoli, etc) Fruit -- Fresh (unpeeled skin & pulp), Dried (prunes, apricots, cherries, etc ),  or stewed ( applesauce)  Whole grain breads, pasta, etc (whole wheat)  Bran cereals  No reading or other relaxing activity while on the toilet. If bowel movements take longer than 5 minutes, you are too constipated

## 2016-11-06 NOTE — ED Notes (Signed)
Pt ambulatory to RR. Severe pain when getting out of bed but steady gait once walking.

## 2016-11-06 NOTE — ED Triage Notes (Signed)
States lower back pain x  5 days , hurts to pee states has been taking OTC meds not helping and also reports he has not had BM in 4 days, has been lifting furniture

## 2016-11-06 NOTE — ED Notes (Signed)
AVS and prescriptions reviewed wit pt and understanding verbalized. No questions or concerns at this time.  NAD. [facility]- htn but PA aware

## 2016-11-06 NOTE — ED Provider Notes (Signed)
[facility] Provider Note   CSN: [id number] Arrival date & time: 11/06/16  0901     History   Chief Complaint Chief Complaint  Patient presents with  . Constipation  . Back Pain    HPI Shane Franklin is a 61 y.o. male.  HPI 61 year old African-American male past medical history significant for heart murmur presents to the emergency department today with complaints of left lower back pain, dysuria, constipation. The patient states that his back pain started 5 days ago. 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 The patient states that the pain is worse with movement. He has tried several over-the-counter medications for pain with little relief. Patient also reports some mild dysuria but denies any hematuria, urgency, frequency. Pt denies any ha, night sweats, hx of ivdu/cancer, loss or bowel or bladder, urinary retention, saddle paresthesias, lower extremity paresthesias. Patient also states he has not had a bowel movement in the past 4 days. He took over-the-counter stool softeners and laxatives last night with little relief. He denies any abdominal pain. Reports some mild nausea but denies any emesis. He denies any testicular pain or swelling. Patient is a one pack-a-day tobacco use.  Pt denies any fever, chill, ha, vision changes, lightheadedness, dizziness, congestion, neck pain, cp, sob, cough, abd pain, v/d, melena, hematochezia, lower extremity paresthesias.   Past Medical History:  Diagnosis Date  . Murmur     Patient Active Problem List   Diagnosis Date Noted  . Weakness of one side of body 08/18/2011  . CVA (cerebrovascular accident) (HCC) 08/18/2011  . Polysubstance abuse 08/18/2011  . Elevated BP 08/18/2011    History reviewed. No pertinent surgical history.     Home Medications    Prior to Admission medications   Medication Sig Start Date End Date Taking? Authorizing Provider    HYDROcodone-acetaminophen (NORCO/VICODIN) 5-325 MG tablet Take 1-2 tablets by mouth every 6 hours as needed for pain and/or cough. 03/28/16   Pisciotta, Joni Reining, PA-C  ibuprofen (ADVIL,MOTRIN) 600 MG tablet Take 1 tablet (600 mg total) by mouth every 6 (six) hours as needed for pain. 01/01/13   Marlon Pel, PA-C  methocarbamol (ROBAXIN) 500 MG tablet Take 2 tablets (1,000 mg total) by mouth 4 (four) times daily as needed (Pain). 10/28/14   Pisciotta, Joni Reining, PA-C  naproxen sodium (ALEVE) 220 MG tablet Take 220 mg by mouth 2 (two) times daily as needed (pain).    [provider]  traMADol (ULTRAM) 50 MG tablet Take 1 tablet (50 mg total) by mouth every 6 (six) hours as needed. 02/23/16   Cathren Laine, MD    Family History Family History  Problem Relation Age of Onset  . Stroke Mother     Social History Social History  Substance Use Topics  . Smoking status: Current Some Day Smoker    Packs/day: 1.00  . Smokeless tobacco: Never Used  . Alcohol use Yes     Comment: every other day either wine cooler or liquor     Allergies   Patient has no known allergies.   Review of Systems Review of Systems  Constitutional: Negative for chills and fever.  HENT: Negative for congestion and sore throat.   Eyes: Negative for visual disturbance.  Respiratory: Negative for cough and shortness of breath.   Cardiovascular: Negative for chest pain.  Gastrointestinal: Positive for constipation and nausea. Negative for abdominal pain, blood in stool, diarrhea and vomiting.  Genitourinary: Positive for dysuria.  Negative for flank pain, frequency, hematuria, scrotal swelling, testicular pain and urgency.  Musculoskeletal: Positive for back pain. Negative for arthralgias, gait problem, myalgias, neck pain and neck stiffness.  Skin: Negative for color change and rash.  Neurological: Negative for dizziness, syncope, weakness, light-headedness, numbness and headaches.  Psychiatric/Behavioral:  Negative for sleep disturbance. The patient is not nervous/anxious.      Physical Exam Updated Vital Signs BP (!) 144/102 (BP Location: Left Arm)   Pulse 75   Temp 98.1 F (36.7 C) (Oral)   Resp 20   SpO2 97%   Physical Exam  Constitutional: He is oriented to person, place, and time. He appears well-developed and well-nourished.  Non-toxic appearance. No distress.  HENT:  Head: Normocephalic and atraumatic.  Mouth/Throat: Oropharynx is clear and moist.  Eyes: Pupils are equal, round, and reactive to light. Conjunctivae are normal. Right eye exhibits no discharge. Left eye exhibits no discharge.  Neck: Normal range of motion. Neck supple.  Cardiovascular: Normal rate, regular rhythm, normal heart sounds and intact distal pulses.  Exam reveals no gallop and no friction rub.   No murmur heard. Pulmonary/Chest: Effort normal and breath sounds normal. No respiratory distress. He has no wheezes. He has no rales. He exhibits no tenderness.  Abdominal: Soft. Bowel sounds are normal. He exhibits no distension. There is no tenderness. There is no rigidity, no rebound, no guarding, no CVA tenderness, no tenderness at McBurney's point and negative Murphy's sign.  Musculoskeletal: Normal range of motion. He exhibits no tenderness.       Arms: No midline T spine. No deformities or step offs noted. Full ROM. Pelvis is stable.   Lumbar paraspinal muscular tenderness to palpation with tenderness musculature noted. Worse with range of motion.  Positive straight leg raise test the left.    Lymphadenopathy:    He has no cervical adenopathy.  Neurological: He is alert and oriented to person, place, and time.  Strength 5 out of 5 in lower extremity bilaterally. Patellar reflexes are normal bilaterally. Sensation intact in all dermatomes. Patient is able to normal gait.  Skin: Skin is warm and dry. Capillary refill takes less than 2 seconds. No rash noted.  Psychiatric: His behavior is normal.  Judgment and thought content normal.  Nursing note and vitals reviewed.    ED Treatments / Results  Labs (all labs ordered are listed, but only abnormal results are displayed) Labs Reviewed  COMPREHENSIVE METABOLIC PANEL  CBC WITH DIFFERENTIAL/PLATELET  LIPASE, BLOOD  URINALYSIS, ROUTINE W REFLEX MICROSCOPIC    EKG  EKG Interpretation None       Radiology Ct Abdomen Pelvis W Contrast  Result Date: 11/06/2016 CLINICAL DATA:  Low back pain radiates to left, States lower back pain x 5 days , hurts to pee states has been taking OTC meds not helping and also reports he has not had BM in 4 days, has been lifting furniture^157mL ISOVUE-300 IOPAMIDOL (ISOVUE-300) INJECTION 61% EXAM: CT ABDOMEN AND PELVIS WITH CONTRAST TECHNIQUE: Multidetector CT imaging of the abdomen and pelvis was performed using the standard protocol following bolus administration of intravenous contrast. CONTRAST:  ISOVUE-300 IOPAMIDOL (ISOVUE-300) INJECTION 61% COMPARISON:  None. FINDINGS: Lower chest: Lung bases are clear. Hepatobiliary: No focal hepatic lesion. No biliary duct dilatation. Gallbladder is normal. Common bile duct is normal. Pancreas: Pancreas is normal. No ductal dilatation. No pancreatic inflammation. Spleen: Normal spleen Adrenals/urinary tract: Adrenal glands and kidneys are normal. The ureters and bladder normal. Bladder is mildly distended calculated volume of 9.3  by 10.8 by 11 cm (volume = 580 cm^3). Stomach/Bowel: Stomach, small bowel, appendix, and cecum are normal. The colon and rectosigmoid colon are normal. Vascular/Lymphatic: Abdominal aorta is normal caliber with atherosclerotic calcification. There is no retroperitoneal or periportal lymphadenopathy. No pelvic lymphadenopathy. Reproductive: Mild prostate enlargement 54 mm. Other: No free fluid. Musculoskeletal: No aggressive osseous lesion. IMPRESSION: 1. No acute findings in the abdomen or pelvis. 2. Mild bladder distention. 3.  Prostatomegaly. 4.  Aortic Atherosclerosis (ICD10-I70.0). Electronically Signed   By: Genevive Bi M.D.   On: 11/06/2016 14:04    Procedures Procedures (including critical care time)  Medications Ordered in ED Medications  morphine 4 MG/ML injection 4 mg (4 mg Intravenous Given 11/06/16 1235)  ondansetron (ZOFRAN) injection 4 mg (4 mg Intravenous Given 11/06/16 1235)  iopamidol (ISOVUE-300) 61 % injection (100 mLs  Contrast Given 11/06/16 1334)  ketorolac (TORADOL) 30 MG/ML injection 15 mg (15 mg Intravenous Given 11/06/16 1505)  cyclobenzaprine (FLEXERIL) tablet 5 mg (5 mg Oral Given 11/06/16 1505)     Initial Impression / Assessment and Plan / ED Course  I have reviewed the triage vital signs and the nursing notes.  Pertinent labs & imaging results that were available during my care of the patient were reviewed by me and considered in my medical decision making (see chart for details).     Patient with back pain, nauseas, urinary symptoms.  No neurological deficits and normal neuro exam.  Patient can walk but states is painful.  No loss of bowel or bladder control.  No concern for cauda equina.  No fever, night sweats, weight loss, h/o cancer, IVDU.  Recent injury with lifting. Exam patient's symptoms seem more consistent with musculoskeletal pain. Patient has no focal abdominal pain. However given his complaints of urinary symptoms and nausea will obtain lab work and imaging.   Laboratory is reassuring. No leukocytosis. UA shows no signs of infection. CT scan of abdomen was obtained that showed some mild prostatomegaly but no other acute findings. The patient's urinary urgency and frequency may due to enlarged prostate. Have given patient urology follow-up. Do not feel he needs treated for UTI this time. The patient's symptoms seem consistent with muscular skeletal injury from lifting. Will provide muscle relaxers, anti-inflammatories and encouraged rice therapy at home. We'll get  orthopedic follow-up if symptoms worsen. Have given strict return precautions for patient.  Patient's blood pressure is elevated in the ED. History of same. Patient states he has been told he has high blood pressure in the past but has never had treatment for. He denies any associated complaints of headache, vision changes, chest pain, shortness of breath. Have consult case manager who has obtain follow-up for patient with primary care this week. Will not treat at this time.  Pt is hemodynamically stable, in NAD, & able to ambulate in the ED. Evaluation does not show pathology that would require ongoing emergent intervention or inpatient treatment. I explained the diagnosis to the patient. Pain has been managed & has no complaints prior to dc. Pt is comfortable with above plan and is stable for discharge at this time. All questions were answered prior to disposition. Strict return precautions for f/u to the ED were discussed. Encouraged follow up with PCP.    Final Clinical Impressions(s) / ED Diagnoses   Final diagnoses:  Acute left-sided low back pain with left-sided sciatica  Constipation, unspecified constipation type    New Prescriptions Discharge Medication List as of 11/06/2016  4:52 PM  START taking these medications   Details  cyclobenzaprine (FLEXERIL) 10 MG tablet Take 1 tablet (10 mg total) by mouth 3 (three) times daily as needed for muscle spasms., Starting Wed 11/06/2016, Print    naproxen (NAPROSYN) 375 MG tablet Take 1 tablet (375 mg total) by mouth 2 (two) times daily., Starting Wed 11/06/2016, Print         Rise Mu, PA-C 11/07/16 1610    Vanetta Mulders, MD 11/13/16 [phone]

## 2016-11-21 ENCOUNTER — Ambulatory Visit: Payer: Self-pay | Admitting: Family Medicine

## 2018-01-14 ENCOUNTER — Other Ambulatory Visit: Payer: Self-pay

## 2018-01-14 ENCOUNTER — Emergency Department (HOSPITAL_COMMUNITY)
Admission: EM | Admit: 2018-01-14 | Discharge: 2018-01-14 | Disposition: A | Discharge status: Home / Self Care | Payer: BLUE CROSS/BLUE SHIELD | Attending: Emergency Medicine | Admitting: Emergency Medicine

## 2018-01-14 ENCOUNTER — Emergency Department (HOSPITAL_COMMUNITY): Payer: BLUE CROSS/BLUE SHIELD

## 2018-01-14 ENCOUNTER — Encounter (HOSPITAL_COMMUNITY): Payer: Self-pay | Admitting: Emergency Medicine

## 2018-01-14 DIAGNOSIS — S6991XA Unspecified injury of right wrist, hand and finger(s), initial encounter: Secondary | ICD-10-CM | POA: Diagnosis not present

## 2018-01-14 DIAGNOSIS — S63621A Sprain of interphalangeal joint of right thumb, initial encounter: Secondary | ICD-10-CM | POA: Diagnosis not present

## 2018-01-14 DIAGNOSIS — F17210 Nicotine dependence, cigarettes, uncomplicated: Secondary | ICD-10-CM | POA: Insufficient documentation

## 2018-01-14 DIAGNOSIS — Y929 Unspecified place or not applicable: Secondary | ICD-10-CM | POA: Diagnosis not present

## 2018-01-14 DIAGNOSIS — Z79899 Other long term (current) drug therapy: Secondary | ICD-10-CM | POA: Diagnosis not present

## 2018-01-14 DIAGNOSIS — M79641 Pain in right hand: Secondary | ICD-10-CM | POA: Diagnosis not present

## 2018-01-14 DIAGNOSIS — Y999 Unspecified external cause status: Secondary | ICD-10-CM | POA: Insufficient documentation

## 2018-01-14 DIAGNOSIS — Y939 Activity, unspecified: Secondary | ICD-10-CM | POA: Diagnosis not present

## 2018-01-14 DIAGNOSIS — W08XXXA Fall from other furniture, initial encounter: Secondary | ICD-10-CM | POA: Diagnosis not present

## 2018-01-14 MED ORDER — HYDROCODONE-ACETAMINOPHEN 5-325 MG PO TABS: 1.0000 | ORAL_TABLET | ORAL | 0 refills | Status: DC | PRN

## 2018-01-14 MED ORDER — HYDROCODONE-ACETAMINOPHEN 5-325 MG PO TABS
1.0000 | ORAL_TABLET | Freq: Once | ORAL | Status: AC
  Administered 2018-01-14: 1 via ORAL
  Filled 2018-01-14: qty 1

## 2018-01-14 NOTE — ED Provider Notes (Signed)
[facility] EMERGENCY DEPARTMENT Provider Note   CSN: [id number] Arrival date & time: 01/14/18  1813     History   Chief Complaint Chief Complaint  Patient presents with  . Hand Pain    HPI Shane Franklin is a 62 y.o. male who presents with right hand pain.  Patient states that he was on a step stool and fell backwards and put his hand out to break his fall and bent his thumb backwards.  He had acute onset of pain and swelling in the right thumb.  He has difficulty moving the thumb due to pain.  He has some numbness and tingling of his fingers.  He is right-handed.  HPI  Past Medical History:  Diagnosis Date  . Murmur     Patient Active Problem List   Diagnosis Date Noted  . Weakness of one side of body 08/18/2011  . CVA (cerebrovascular accident) (HCC) 08/18/2011  . Polysubstance abuse (HCC) 08/18/2011  . Elevated BP 08/18/2011    History reviewed. No pertinent surgical history.      Home Medications    Prior to Admission medications   Medication Sig Start Date End Date Taking? Authorizing Provider  cyclobenzaprine (FLEXERIL) 10 MG tablet Take 1 tablet (10 mg total) by mouth 3 (three) times daily as needed for muscle spasms. 11/06/16   Rise Mu, PA-C  HYDROcodone-acetaminophen (NORCO/VICODIN) 5-325 MG tablet Take 1 tablet by mouth every 4 (four) hours as needed. 01/14/18   Bethel Born, PA-C  ibuprofen (ADVIL,MOTRIN) 600 MG tablet Take 1 tablet (600 mg total) by mouth every 6 (six) hours as needed for pain. 01/01/13   Marlon Pel, PA-C  naproxen (NAPROSYN) 375 MG tablet Take 1 tablet (375 mg total) by mouth 2 (two) times daily. 11/06/16   Rise Mu, PA-C  naproxen sodium (ALEVE) 220 MG tablet Take 220 mg by mouth 2 (two) times daily as needed (pain).    [provider]    Family History Family History  Problem Relation Age of Onset  . Stroke Mother     Social History Social History   Tobacco Use  .  Smoking status: Current Some Day Smoker    Packs/day: 1.00  . Smokeless tobacco: Never Used  Substance Use Topics  . Alcohol use: Yes    Comment: every other day either wine cooler or liquor  . Drug use: Yes    Types: Cocaine, Marijuana    Comment: daily     Allergies   Patient has no known allergies.   Review of Systems Review of Systems  Musculoskeletal: Positive for arthralgias and joint swelling.  Skin: Negative for wound.  Neurological: Positive for numbness. Negative for weakness.     Physical Exam Updated Vital Signs BP (!) 161/92 (BP Location: Left Arm)   Pulse 73   Temp 99.1 F (37.3 C) (Oral)   Resp 16   SpO2 93%   Physical Exam  Constitutional: He is oriented to person, place, and time. He appears well-developed and well-nourished. No distress.  HENT:  Head: Normocephalic and atraumatic.  Eyes: Pupils are equal, round, and reactive to light. Conjunctivae are normal. Right eye exhibits no discharge. Left eye exhibits no discharge. No scleral icterus.  Neck: Normal range of motion.  Cardiovascular: Normal rate.  Pulmonary/Chest: Effort normal. No respiratory distress.  Abdominal: He exhibits no distension.  Musculoskeletal:  Right hand: Swelling over the right thumb.  Exquisite tenderness over the MCP and IP joint of the thumb.  No anatomical snuffbox tenderness.  Decreased range motion of all the fingers.  Subjective numbness of the fingertips.  2+ radial pulse.  Neurological: He is alert and oriented to person, place, and time.  Skin: Skin is warm and dry.  Psychiatric: He has a normal mood and affect. His behavior is normal.  Nursing note and vitals reviewed.    ED Treatments / Results  Labs (all labs ordered are listed, but only abnormal results are displayed) Labs Reviewed - No data to display  EKG None  Radiology Dg Hand Complete Right  Result Date: 01/14/2018 CLINICAL DATA:  RIGHT hand pain after striking a wall today. EXAM: RIGHT HAND -  COMPLETE 3+ VIEW COMPARISON:  RIGHT hand radiograph January 01, 2013 FINDINGS: No fracture deformity or dislocation. Moderate to severe first IP, second and third DIP joint space narrowing with periarticular spurring compatible with osteoarthrosis. No destructive bony lesions. Soft tissue planes are non suspicious. IMPRESSION: 1. No acute fracture deformity or dislocation. 2. Similar degenerative change. Electronically Signed   By: Awilda Metro M.D.   On: 01/14/2018 19:32    Procedures Procedures (including critical care time)  Medications Ordered in ED Medications - No data to display   Initial Impression / Assessment and Plan / ED Course  I have reviewed the triage vital signs and the nursing notes.  Pertinent labs & imaging results that were available during my care of the patient were reviewed by me and considered in my medical decision making (see chart for details).  62 year old male presents with a right hand injury earlier this evening.  X-rays negative for fracture.  Will treat as a sprain.  He was given a Velcro thumb spica and encouraged to rest, elevate and take medicine for pain as needed.  He was given a work note.  Final Clinical Impressions(s) / ED Diagnoses   Final diagnoses:  Sprain of interphalangeal joint of right thumb, initial encounter    ED Discharge Orders         Ordered    HYDROcodone-acetaminophen (NORCO/VICODIN) 5-325 MG tablet  Every 4 hours PRN     01/14/18 1952           Bethel Born, PA-C 01/14/18 2233    Raeford Razor, MD 01/15/18 1002

## 2018-01-14 NOTE — Discharge Instructions (Signed)
Please rest and elevated the hand to help with swelling Keep hand in a brace for a couple days, then do range of motion exercises to prevent stiffness Take pain medicine as directed. Do not drive or drink alcohol while taking this medicine Please follow up with your doctor

## 2018-01-14 NOTE — ED Triage Notes (Signed)
Pt c/o right hand pain and swelling after hitting a wall today.

## 2018-01-14 NOTE — ED Notes (Signed)
Pt stable, ambulatory, states understanding of discharge instructions 

## 2018-12-16 ENCOUNTER — Encounter (HOSPITAL_COMMUNITY): Payer: Self-pay | Admitting: Emergency Medicine

## 2018-12-16 ENCOUNTER — Ambulatory Visit (HOSPITAL_COMMUNITY)
Admission: EM | Admit: 2018-12-16 | Discharge: 2018-12-16 | Disposition: A | Discharge status: Home / Self Care | Payer: BC Managed Care – PPO | Attending: Emergency Medicine | Admitting: Emergency Medicine

## 2018-12-16 ENCOUNTER — Other Ambulatory Visit: Payer: Self-pay

## 2018-12-16 DIAGNOSIS — Z20822 Contact with and (suspected) exposure to covid-19: Secondary | ICD-10-CM

## 2018-12-16 DIAGNOSIS — R030 Elevated blood-pressure reading, without diagnosis of hypertension: Secondary | ICD-10-CM | POA: Diagnosis not present

## 2018-12-16 DIAGNOSIS — Z20828 Contact with and (suspected) exposure to other viral communicable diseases: Secondary | ICD-10-CM

## 2018-12-16 DIAGNOSIS — R05 Cough: Secondary | ICD-10-CM

## 2018-12-16 DIAGNOSIS — R059 Cough, unspecified: Secondary | ICD-10-CM

## 2018-12-16 NOTE — ED Provider Notes (Signed)
[facility]    CSN: [id number] Arrival date & time: 12/16/18  1453      History   Chief Complaint Chief Complaint  Patient presents with  . Cough    LAST NIGHT    HPI Shane Franklin is a 63 y.o. male.   Patient presents with nonproductive cough since yesterday evening.  He denies fever, chills, sore throat, congestion, shortness of breath, vomiting, diarrhea, or other symptoms.  Past medical history significant for CVA, elevated blood pressure, polysubstance abuse.  The history is provided by the patient.    Past Medical History:  Diagnosis Date  . Murmur     Patient Active Problem List   Diagnosis Date Noted  . Weakness of one side of body 08/18/2011  . CVA (cerebrovascular accident) (HCC) 08/18/2011  . Polysubstance abuse (HCC) 08/18/2011  . Elevated BP 08/18/2011    History reviewed. No pertinent surgical history.     Home Medications    Prior to Admission medications   Medication Sig Start Date End Date Taking? Authorizing Provider  cyclobenzaprine (FLEXERIL) 10 MG tablet Take 10 mg by mouth as needed. 11/29/15  Yes [provider]  ibuprofen (ADVIL,MOTRIN) 600 MG tablet Take 1 tablet (600 mg total) by mouth every 6 (six) hours as needed for pain. 01/01/13  Yes Neva Seat, Tiffany, PA-C  cyclobenzaprine (FLEXERIL) 10 MG tablet Take 1 tablet (10 mg total) by mouth 3 (three) times daily as needed for muscle spasms. 11/06/16   Rise Mu, PA-C  HYDROcodone-acetaminophen (NORCO/VICODIN) 5-325 MG tablet Take 1 tablet by mouth every 4 (four) hours as needed. 01/14/18   Bethel Born, PA-C  naproxen (NAPROSYN) 375 MG tablet Take 1 tablet (375 mg total) by mouth 2 (two) times daily. 11/06/16   Rise Mu, PA-C  naproxen sodium (ALEVE) 220 MG tablet Take 220 mg by mouth 2 (two) times daily as needed (pain).    [provider]    Family History Family History  Problem Relation Age of Onset  . Stroke Mother   . Cancer Mother    . Hypertension Mother     Social History Social History   Tobacco Use  . Smoking status: Current Some Day Smoker    Packs/day: 1.00  . Smokeless tobacco: Never Used  Substance Use Topics  . Alcohol use: Yes    Comment: every other day either wine cooler or liquor  . Drug use: Yes    Types: Cocaine, Marijuana    Comment: daily     Allergies   Patient has no known allergies.   Review of Systems Review of Systems  Constitutional: Negative for chills and fever.  HENT: Negative for congestion, ear pain, rhinorrhea and sore throat.   Eyes: Negative for pain and visual disturbance.  Respiratory: Positive for cough. Negative for shortness of breath.   Cardiovascular: Negative for chest pain and palpitations.  Gastrointestinal: Negative for abdominal pain, diarrhea and vomiting.  Genitourinary: Negative for dysuria and hematuria.  Musculoskeletal: Negative for arthralgias and back pain.  Skin: Negative for color change and rash.  Neurological: Negative for seizures and syncope.  All other systems reviewed and are negative.    Physical Exam Triage Vital Signs ED Triage Vitals  Enc Vitals Group     BP 12/16/18 1550 (!) 170/90     Pulse Rate 12/16/18 1550 65     Resp --      Temp 12/16/18 1550 98.7 F (37.1 C)     Temp Source 12/16/18 1550  Oral     SpO2 12/16/18 1550 98 %     Weight 12/16/18 1545 162 lb (73.5 kg)     Height --      Head Circumference --      Peak Flow --      Pain Score 12/16/18 1544 6     Pain Loc --      Pain Edu? --      Excl. in GC? --    No data found.  Updated Vital Signs BP (!) 170/90 (BP Location: Right Arm)   Pulse 65   Temp 98.7 F (37.1 C) (Oral)   Wt 162 lb (73.5 kg)   SpO2 98%   BMI 23.92 kg/m   Visual Acuity Right Eye Distance:   Left Eye Distance:   Bilateral Distance:    Right Eye Near:   Left Eye Near:    Bilateral Near:     Physical Exam Vitals signs and nursing note reviewed.  Constitutional:       Appearance: He is well-developed.  HENT:     Head: Normocephalic and atraumatic.     Right Ear: Tympanic membrane normal.     Left Ear: Tympanic membrane normal.     Nose: Nose normal.     Mouth/Throat:     Mouth: Mucous membranes are moist.     Pharynx: Oropharynx is clear.  Eyes:     Conjunctiva/sclera: Conjunctivae normal.  Neck:     Musculoskeletal: Neck supple.  Cardiovascular:     Rate and Rhythm: Normal rate and regular rhythm.     Heart sounds: No murmur.  Pulmonary:     Effort: Pulmonary effort is normal. No respiratory distress.     Breath sounds: Normal breath sounds. No wheezing or rhonchi.     Comments: No cough noted during exam. Abdominal:     General: Bowel sounds are normal.     Palpations: Abdomen is soft.     Tenderness: There is no abdominal tenderness. There is no guarding or rebound.  Skin:    General: Skin is warm and dry.  Neurological:     Mental Status: He is alert.      UC Treatments / Results  Labs (all labs ordered are listed, but only abnormal results are displayed) Labs Reviewed - No data to display  EKG   Radiology No results found.  Procedures Procedures (including critical care time)  Medications Ordered in UC Medications - No data to display  Initial Impression / Assessment and Plan / UC Course  I have reviewed the triage vital signs and the nursing notes.  Pertinent labs & imaging results that were available during my care of the patient were reviewed by me and considered in my medical decision making (see chart for details).    Cough, suspected COVID.  Elevated blood pressure reading.  Patient is well-appearing and his exam is unremarkable.  COVID test performed here.  Instructed patient to self quarantine until his test results are back.  Instructed patient to go to the emergency department if he develops high fever, shortness of breath, severe diarrhea, or other concerning symptoms.  Discussed with patient that his blood  pressure is elevated and needs to be rechecked by his PCP or the PCP suggested in 2 weeks.  Patient agrees with plan of care.    Final Clinical Impressions(s) / UC Diagnoses   Final diagnoses:  Elevated blood pressure reading  Cough  Suspected COVID-19 virus infection     Discharge Instructions  Your COVID test is pending.  You should self quarantine until your test result is back and is negative.    Go to the emergency department if you develop high fever, shortness of breath, severe diarrhea, or other concerning symptoms.    Your blood pressure is elevated today at 170/90.  Please have this rechecked by your primary care provider in 2 weeks.  If you do not have a primary care provider, one is suggested below.       ED Prescriptions    None     PDMP not reviewed this encounter.   Sharion Balloon, NP 12/16/18 1606

## 2018-12-16 NOTE — Discharge Instructions (Addendum)
Your COVID test is pending.  You should self quarantine until your test result is back and is negative.    Go to the emergency department if you develop high fever, shortness of breath, severe diarrhea, or other concerning symptoms.    Your blood pressure is elevated today at 170/90.  Please have this rechecked by your primary care provider in 2 weeks.  If you do not have a primary care provider, one is suggested below.

## 2018-12-16 NOTE — ED Triage Notes (Signed)
Pt. States since last night he has had a se[place] cough its causing abdominal pain to flare up, which he has had for 1 month.

## 2018-12-18 LAB — NOVEL CORONAVIRUS, NAA (HOSP ORDER, SEND-OUT TO REF LAB; TAT 18-24 HRS): SARS-CoV-2, NAA: NOT DETECTED

## 2019-04-17 IMAGING — DX DG HAND COMPLETE 3+V*R*
3 series · 3 of 3 positions shown · non-contrast
Comparison: RIGHT hand radiograph January 01, 2013

CLINICAL DATA: RIGHT hand pain after striking a wall today.

EXAM:
RIGHT HAND - COMPLETE 3+ VIEW

[hand ap]
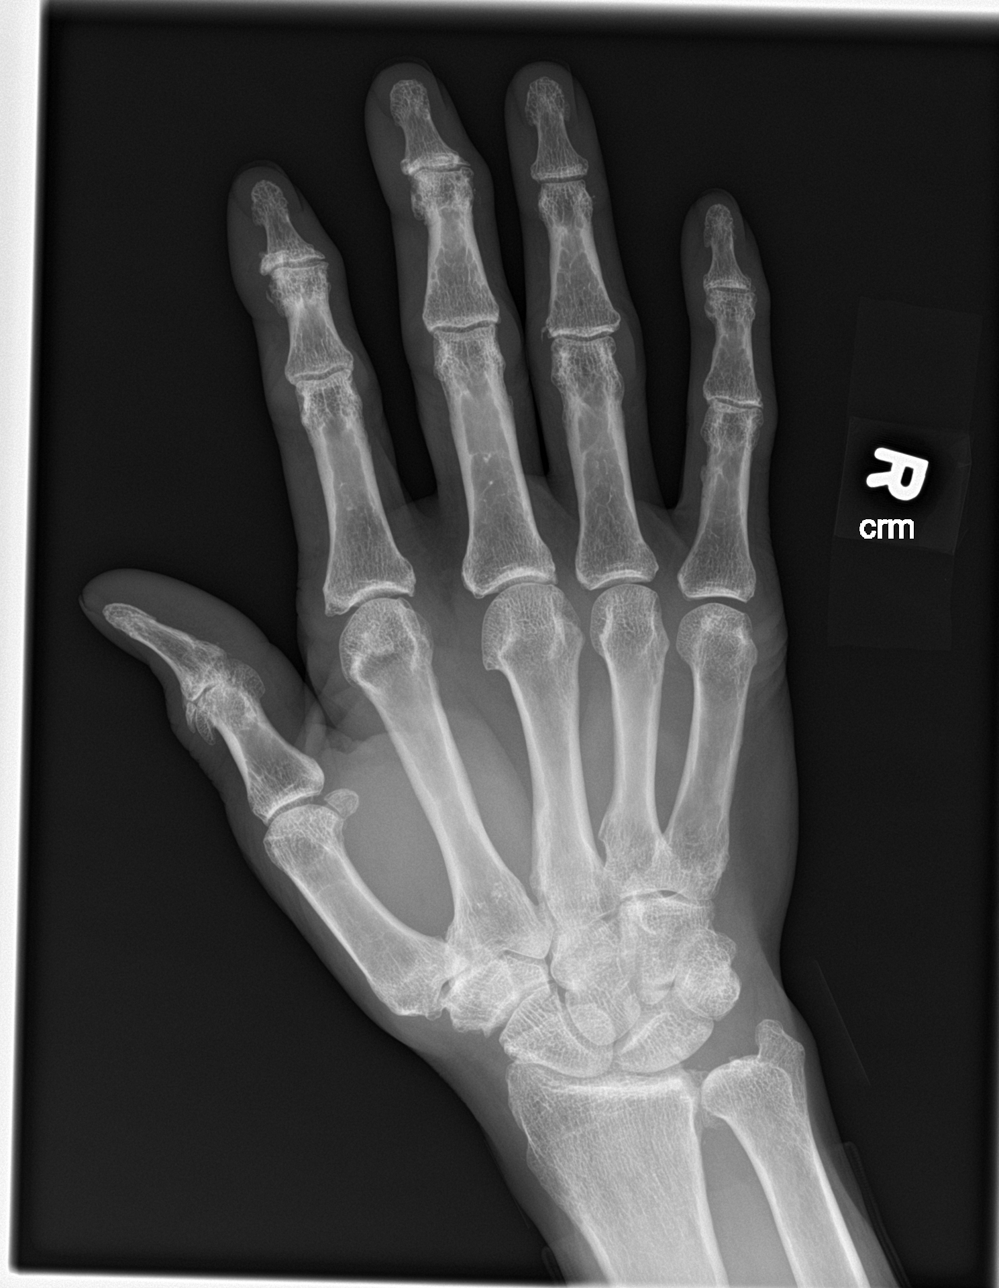

[hand obl]
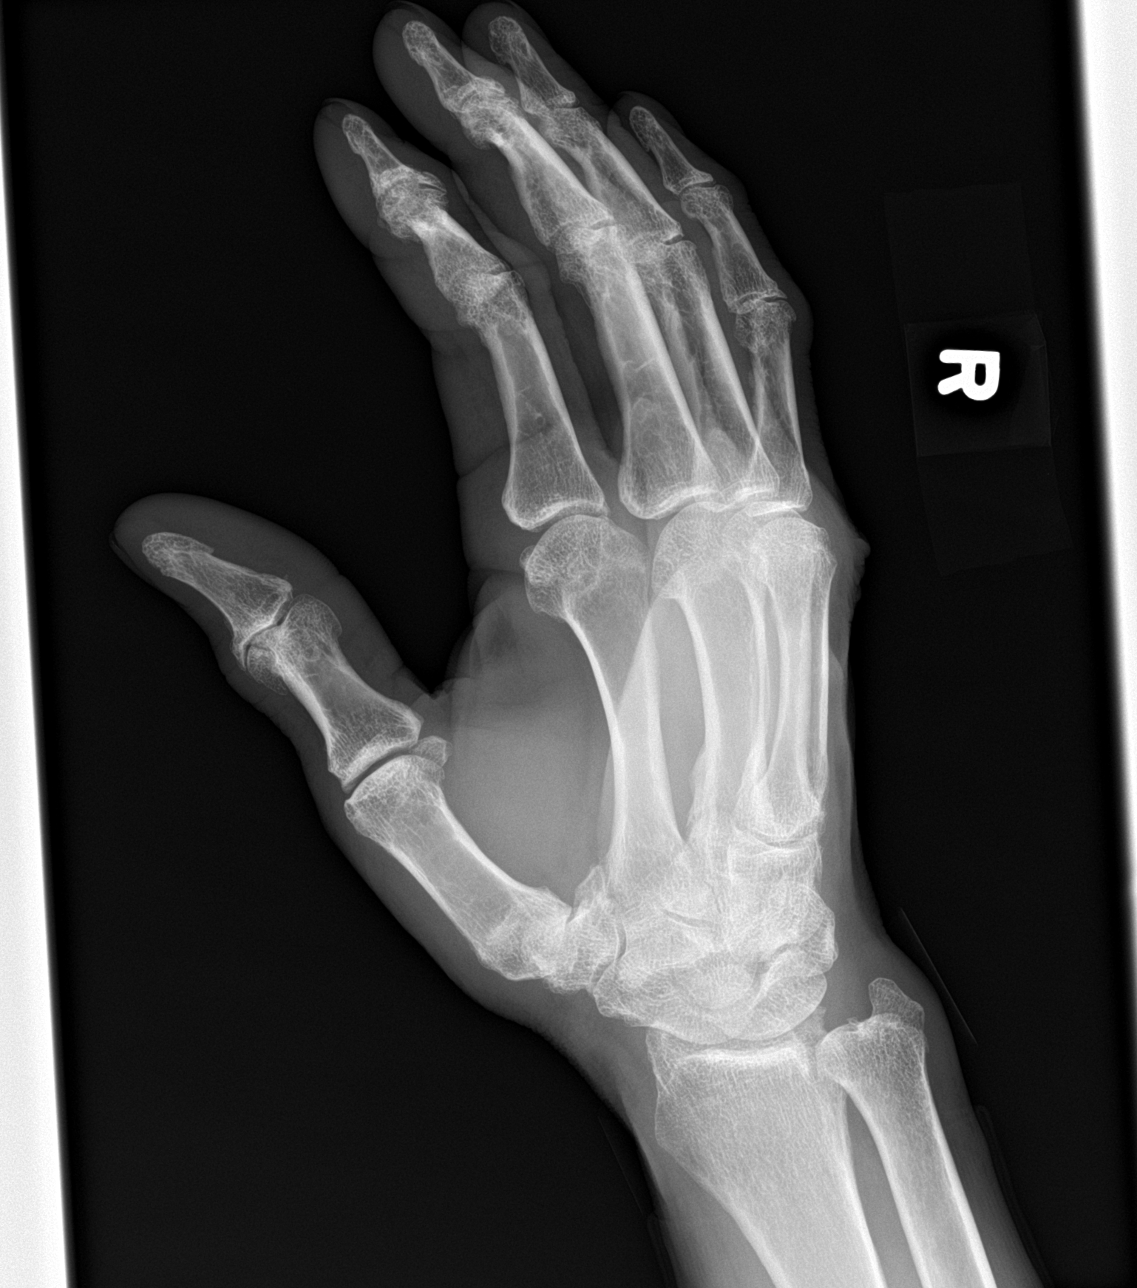

[hand lat]
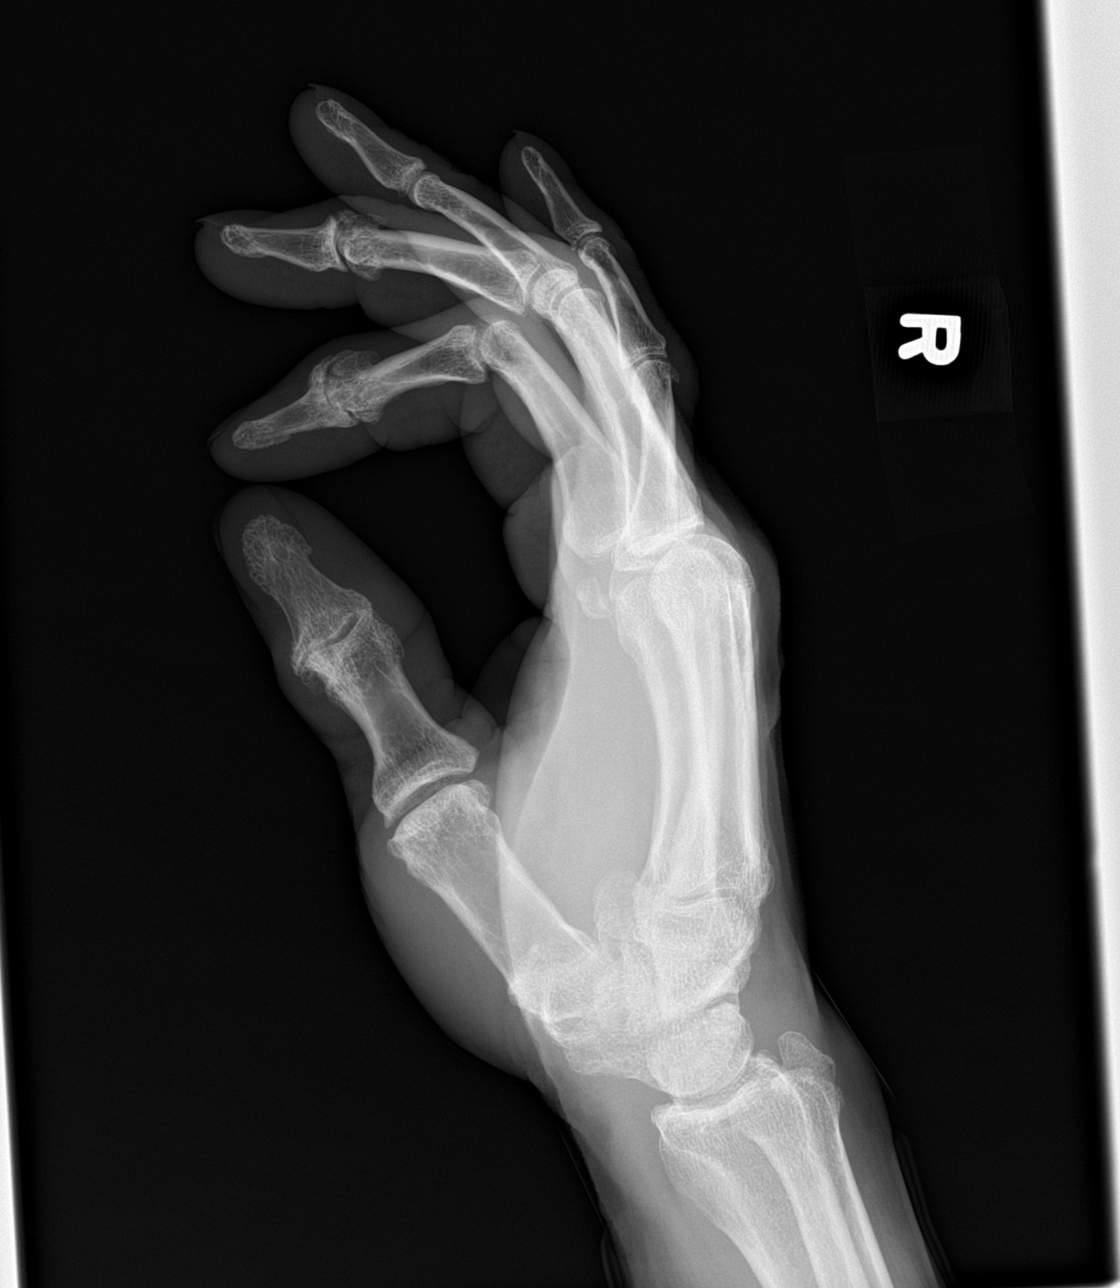

[3 of 3 positions shown; findings below may reference images not displayed]

FINDINGS: No fracture deformity or dislocation. Moderate to severe first IP,
second and third DIP joint space narrowing with periarticular
spurring compatible with osteoarthrosis. No destructive bony
lesions. Soft tissue planes are non suspicious.
IMPRESSION: 1. No acute fracture deformity or dislocation.
2. Similar degenerative change.

## 2019-07-02 ENCOUNTER — Other Ambulatory Visit: Payer: Self-pay

## 2019-07-02 ENCOUNTER — Ambulatory Visit (HOSPITAL_COMMUNITY)
Admission: EM | Admit: 2019-07-02 | Discharge: 2019-07-02 | Disposition: A | Discharge status: Home / Self Care | Payer: BC Managed Care – PPO | Attending: Physician Assistant | Admitting: Physician Assistant

## 2019-07-02 DIAGNOSIS — M25561 Pain in right knee: Secondary | ICD-10-CM | POA: Insufficient documentation

## 2019-07-02 DIAGNOSIS — S8991XA Unspecified injury of right lower leg, initial encounter: Secondary | ICD-10-CM | POA: Diagnosis not present

## 2019-07-02 DIAGNOSIS — I10 Essential (primary) hypertension: Secondary | ICD-10-CM | POA: Insufficient documentation

## 2019-07-02 LAB — CBC
HCT: 43.9 % (ref 39.0–52.0)
Hemoglobin: 14.5 g/dL (ref 13.0–17.0)
MCH: 31.1 pg (ref 26.0–34.0)
MCHC: 33 g/dL (ref 30.0–36.0)
MCV: 94.2 fL (ref 80.0–100.0)
Platelets: 190 10*3/uL (ref 150–400)
RBC: 4.66 MIL/uL (ref 4.22–5.81)
RDW: 13.8 % (ref 11.5–15.5)
WBC: 5.3 10*3/uL (ref 4.0–10.5)
nRBC: 0 % (ref 0.0–0.2)

## 2019-07-02 LAB — BASIC METABOLIC PANEL
Anion gap: 7 (ref 5–15)
BUN: 16 mg/dL (ref 8–23)
CO2: 25 mmol/L (ref 22–32)
Calcium: 8.7 mg/dL — ABNORMAL LOW (ref 8.9–10.3)
Chloride: 105 mmol/L (ref 98–111)
Creatinine, Ser: 1.1 mg/dL (ref 0.61–1.24)
GFR calc Af Amer: >60 mL/min (ref >60)
GFR calc non Af Amer: >60 mL/min (ref >60)
Glucose, Bld: 111 mg/dL — ABNORMAL HIGH (ref 70–99)
Potassium: 3.9 mmol/L (ref 3.5–5.1)
Sodium: 137 mmol/L (ref 135–145)

## 2019-07-02 MED ORDER — AMLODIPINE BESYLATE 10 MG PO TABS: 10.0000 mg | ORAL_TABLET | Freq: Every day | ORAL | 0 refills | Status: DC

## 2019-07-02 NOTE — ED Provider Notes (Signed)
[facility]    CSN: [id number] Arrival date & time: 07/02/19  0915      History   Chief Complaint Chief Complaint  Patient presents with  . Fall    HPI Shane Franklin is a 64 y.o. male.   Patient past medical history of TIA , CVA and elevated blood pressure reports urgent care for evaluation of right knee pain.  He reports 2 days ago on Wednesday he was at work when he slipped and his feet this brought apart and felt his right knee and words.  He had immediate pain on the inside part of his right knee that is persisted.  He has had painful gait and has been limping.  He reports swelling initially that he iced and this is gone down some.  He does report some general improvement in the pain and swelling in the right knee however still limping significantly.  He initially had some left knee pain but this is mostly improved.  He denies falling onto the knee or falling at all.  He denies that he got lightheaded or weak prior to the fall.  He is adamant that he slipped on material on the ground.  Denies any numbness tingling or weakness in the right leg.  Patient does not have primary care and has not seen anybody regularly.  He knows that his blood pressure has been elevated before.  He denies any cough, chest pain, shortness of breath, acute change in vision or serious headaches.   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     Past Medical History:  Diagnosis Date  . Murmur     Patient Active Problem List   Diagnosis Date Noted  . Weakness of one side of body 08/18/2011  . CVA (cerebrovascular accident) (HCC) 08/18/2011  . Polysubstance abuse (HCC) 08/18/2011  . Elevated BP 08/18/2011    No past surgical history on file.     Home Medications    Prior to Admission medications   Medication Sig Start Date End Date Taking? Authorizing Provider  amLODipine (NORVASC) 10 MG tablet  Take 1 tablet (10 mg total) by mouth daily. 07/02/19 08/01/19  Tyrick Dunagan, Veryl Speak, PA-C  ibuprofen (ADVIL,MOTRIN) 600 MG tablet Take 1 tablet (600 mg total) by mouth every 6 (six) hours as needed for pain. 01/01/13   Marlon Pel, PA-C  naproxen (NAPROSYN) 375 MG tablet Take 1 tablet (375 mg total) by mouth 2 (two) times daily. 11/06/16   Rise Mu, PA-C  naproxen sodium (ALEVE) 220 MG tablet Take 220 mg by mouth 2 (two) times daily as needed (pain).    [provider]    Family History Family History  Problem Relation Age of Onset  . Stroke Mother   . Cancer Mother   . Hypertension Mother     Social History Social History   Tobacco Use  . Smoking status: Current Some Day Smoker    Packs/day: 1.00  . Smokeless tobacco: Never Used  Substance Use Topics  . Alcohol use: Yes    Comment: every other day either wine cooler or liquor  . Drug use: Yes    Types: Cocaine, Marijuana    Comment: daily     Allergies   Patient has no known allergies.   Review of Systems Review of Systems  Per HPI Physical Exam Triage Vital Signs ED Triage Vitals [07/02/19 0941]  Enc Vitals Group  BP (!) 187/114     Pulse Rate 76     Resp 16     Temp 98 F (36.7 C)     Temp src      SpO2 98 %     Weight      Height      Head Circumference      Peak Flow      Pain Score 5     Pain Loc      Pain Edu?      Excl. in GC?    No data found.  Updated Vital Signs BP (!) 187/114   Pulse 76   Temp 98 F (36.7 C)   Resp 16   SpO2 98%   Visual Acuity Right Eye Distance:   Left Eye Distance:   Bilateral Distance:    Right Eye Near:   Left Eye Near:    Bilateral Near:     Physical Exam Vitals and nursing note reviewed.  Constitutional:      Appearance: Normal appearance. He is well-developed. He is not ill-appearing.  HENT:     Head: Normocephalic and atraumatic.  Eyes:     Conjunctiva/sclera: Conjunctivae normal.     Pupils: Pupils are equal, round, and  reactive to light.  Cardiovascular:     Rate and Rhythm: Normal rate and regular rhythm.     Heart sounds: No murmur.  Pulmonary:     Effort: Pulmonary effort is normal. No respiratory distress.     Breath sounds: Normal breath sounds.  Musculoskeletal:     Cervical back: Neck supple.     Comments: Right knee with mild swelling over the medial aspect.  There is tenderness the medial aspect above the joint line.  Knee is grossly stable with anterior and posterior drawer test.  Pain elicited with valgus stress.  No pain with varus stress.  No crepitus or clicking on McMurray's.  Patient with significant limping due to pain.  Skin:    General: Skin is warm and dry.  Neurological:     General: No focal deficit present.     Mental Status: He is alert and oriented to person, place, and time.      UC Treatments / Results  Labs (all labs ordered are listed, but only abnormal results are displayed) Labs Reviewed  BASIC METABOLIC PANEL  CBC    EKG   Radiology No results found.  Procedures Procedures (including critical care time)  Medications Ordered in UC Medications - No data to display  Initial Impression / Assessment and Plan / UC Course  I have reviewed the triage vital signs and the nursing notes.  Pertinent labs & imaging results that were available during my care of the patient were reviewed by me and considered in my medical decision making (see chart for details).  Vitals with BMI 07/02/2019 12/16/2018 01/14/2018  Height - - -  Weight - 162 lbs -  BMI - - -  Systolic 187 170 833  Diastolic 114 90 92  Pulse 76 65 73      #Right knee injury #Essential hypertension Patient is a 64 year old with past history of TIA/CVA and elevated blood pressure presenting with right knee injury and persistent elevated blood pressure.  From a blood pressure standpoint will make the formal diagnosis of hypertension with last blood pressure of 170/90 and 187/114 today.  Patient is  asymptomatic from a blood pressure standpoint.  Will start amlodipine 10,  BMP and CBC obtained and primary care  follow-up instructions given.  Patient verbalized understanding of this.  Stressed the importance of blood pressure management and follow-up with primary care -From an injury standpoint we will defer imaging today however I do have suspicion for possible MCL injury or meniscal injury.  I recommended the patient follow-up in orthopedic urgent care with [facility] today.  Will place an Ace wrap and crutches.  Patient agrees that he will report to orthopedic urgent care for further evaluation of his right knee injury.  This decision was based on faster evaluation and need to return to work if possible. -Patient given strict emergency department precautions for chest pain, shortness of breath or severe headache.  Also discussed signs of stroke with patient.  He verbalizes understanding and repeats back instructions for today's visit.   Final Clinical Impressions(s) / UC Diagnoses   Final diagnoses:  Essential hypertension  Injury of right knee, initial encounter  Acute pain of right knee     Discharge Instructions     Given the amount of pain in your knee and the injury patter, I want you to go to the Orthopedic Urgent care to have further evaluation. I am worried you may have injured a ligament in your knee. They can further evaluate your injury with different testing. Take all further instructions from them. Two options are supplied, [facility] and [facility]  Use the crutches and Ace wrap  Take tylenol for pain and ice and elevate knee.   [facility] offers Same Day Appointments for your orthopedic needs at our [place] location. Please call [phone] to schedule an appointment. Walk-in [facility] is open Monday-Friday: 8:00 AM to 8:00 PM. [address], [place], [place], [place] [address]  With regard to  your blood pressure today, I have diagnosed you with Hypertension. I want you to begin the Amlodipine today.  - I have sent labs and will notify of any results requiring attention. - please schedule follow up with the Primary care office supplied        ED Prescriptions    Medication Sig Dispense Auth. Provider   amLODipine (NORVASC) 10 MG tablet Take 1 tablet (10 mg total) by mouth daily. 30 tablet Kalyani Maeda, Marguerita Beards, PA-C     PDMP not reviewed this encounter.   Purnell Shoemaker, PA-C 07/02/19 1106

## 2019-07-02 NOTE — Discharge Instructions (Addendum)
Given the amount of pain in your knee and the injury patter, I want you to go to the Orthopedic Urgent care to have further evaluation. I am worried you may have injured a ligament in your knee. They can further evaluate your injury with different testing. Take all further instructions from them. Two options are supplied, [facility] and [facility]  Use the crutches and Ace wrap  Take tylenol for pain and ice and elevate knee.   [facility] offers Same Day Appointments for your orthopedic needs at our [place] location. Please call [phone] to schedule an appointment. Walk-in [facility] is open Monday-Friday: 8:00 AM to 8:00 PM. [address], [place], [place] [address]  With regard to your blood pressure today, I have diagnosed you with Hypertension. I want you to begin the Amlodipine today.  - I have sent labs and will notify of any results requiring attention. - please schedule follow up with the Primary care office supplied

## 2019-07-02 NOTE — ED Triage Notes (Signed)
Slip and fall at work on Wednesday landing on both knees, c/o pain to both with right worse than left.

## 2019-07-05 ENCOUNTER — Ambulatory Visit: Payer: BC Managed Care – PPO

## 2019-07-06 ENCOUNTER — Ambulatory Visit (INDEPENDENT_AMBULATORY_CARE_PROVIDER_SITE_OTHER): Payer: BC Managed Care – PPO | Admitting: Internal Medicine

## 2019-07-06 ENCOUNTER — Encounter: Payer: Self-pay | Admitting: Internal Medicine

## 2019-07-06 VITALS — BP 139/89 | HR 83 | Temp 99.1°F | Ht 69.0 in | Wt 168.1 lb

## 2019-07-06 DIAGNOSIS — I10 Essential (primary) hypertension: Secondary | ICD-10-CM

## 2019-07-06 DIAGNOSIS — E785 Hyperlipidemia, unspecified: Secondary | ICD-10-CM | POA: Diagnosis not present

## 2019-07-06 DIAGNOSIS — M25561 Pain in right knee: Secondary | ICD-10-CM

## 2019-07-06 DIAGNOSIS — Z8673 Personal history of transient ischemic attack (TIA), and cerebral infarction without residual deficits: Secondary | ICD-10-CM

## 2019-07-06 DIAGNOSIS — I639 Cerebral infarction, unspecified: Secondary | ICD-10-CM

## 2019-07-06 DIAGNOSIS — F17200 Nicotine dependence, unspecified, uncomplicated: Secondary | ICD-10-CM | POA: Diagnosis not present

## 2019-07-06 MED ORDER — ASPIRIN EC 81 MG PO TBEC: 81.0000 mg | DELAYED_RELEASE_TABLET | Freq: Every day | ORAL | 1 refills | Status: DC

## 2019-07-06 MED ORDER — AMLODIPINE BESYLATE 5 MG PO TABS: 5.0000 mg | ORAL_TABLET | Freq: Every day | ORAL | 0 refills | Status: DC

## 2019-07-06 NOTE — Patient Instructions (Signed)
Mr. Shane Franklin, please follow up with [facility] as we discussed.  Please start 81mg  asa, amlodipine 5mg .  I will check your cholesterol and likely start you on a statin next week.

## 2019-07-06 NOTE — Progress Notes (Signed)
New Patient Office Visit  Subjective:  Patient ID: Shane Franklin, male    DOB: 02-05-1956  Age: 64 y.o. MRN: [id number]  CC:  Chief Complaint  Patient presents with  . Follow-up    urgent care,     HPI Shane Franklin presents for HTN, Knee pain, hx of CVA, HLD    Past Medical History:  Diagnosis Date  . Murmur   . Weakness of one side of body 08/18/2011    History reviewed. No pertinent surgical history.  Family History  Problem Relation Age of Onset  . Stroke Mother   . Cancer Mother   . Hypertension Mother     Social History   Socioeconomic History  . Marital status: Single    Spouse name: Not on file  . Number of children: Not on file  . Years of education: Not on file  . Highest education level: Not on file  Occupational History  . Not on file  Tobacco Use  . Smoking status: Current Some Day Smoker    Packs/day: 1.00  . Smokeless tobacco: Never Used  Substance and Sexual Activity  . Alcohol use: Yes    Comment: every other day either wine cooler or liquor  . Drug use: Yes    Types: Cocaine, Marijuana    Comment: daily  . Sexual activity: Not on file    Comment: none tonight  Other Topics Concern  . Not on file  Social History Narrative  . Not on file   Social Determinants of Health   Financial Resource Strain:   . Difficulty of Paying Living Expenses:   Food Insecurity:   . Worried About Programme researcher, broadcasting/film/video in the Last Year:   . Barista in the Last Year:   Transportation Needs:   . Freight forwarder (Medical):   Marland Kitchen Lack of Transportation (Non-Medical):   Physical Activity:   . Days of Exercise per Week:   . Minutes of Exercise per Session:   Stress:   . Feeling of Stress :   Social Connections:   . Frequency of Communication with Friends and Family:   . Frequency of Social Gatherings with Friends and Family:   . Attends Religious Services:   . Active Member of Clubs or Organizations:   . Attends Banker  Meetings:   Marland Kitchen Marital Status:   Intimate Partner Violence:   . Fear of Current or Ex-Partner:   . Emotionally Abused:   Marland Kitchen Physically Abused:   . Sexually Abused:     ROS Review of Systems  Constitutional: Negative for activity change.  HENT: Negative for nosebleeds.   Respiratory: Negative for shortness of breath and wheezing.   Cardiovascular: Negative for chest pain and leg swelling.  Gastrointestinal: Negative for vomiting.  Endocrine: Negative for cold intolerance and heat intolerance.  Genitourinary: Negative for flank pain and frequency.  Musculoskeletal: Positive for arthralgias. Negative for joint swelling.  Skin: Negative for rash.  Neurological: Negative for dizziness.  Psychiatric/Behavioral: Negative for agitation and behavioral problems.    Objective:   Today's Vitals: BP 139/89 (BP Location: Right Arm, Patient Position: Sitting, Cuff Size: Small)   Pulse 83   Temp 99.1 F (37.3 C) (Oral)   Ht 5\' 9"  (1.753 m)   Wt 168 lb 1.6 oz (76.2 kg)   SpO2 98%   BMI 24.82 kg/m   Physical Exam Constitutional:      Appearance: He is not diaphoretic.  Eyes:  General: No scleral icterus.       Right eye: No discharge.        Left eye: No discharge.  Cardiovascular:     Rate and Rhythm: Normal rate and regular rhythm.     Heart sounds: Normal heart sounds. No murmur. No friction rub. No gallop.   Pulmonary:     Effort: Pulmonary effort is normal. No respiratory distress.     Breath sounds: Normal breath sounds. No wheezing or rales.  Abdominal:     General: Bowel sounds are normal. There is no distension.     Palpations: Abdomen is soft. There is no mass.     Tenderness: There is no abdominal tenderness. There is no guarding.  Musculoskeletal:        General: No swelling.     Right knee: No swelling. Normal range of motion. No LCL laxity, MCL laxity, ACL laxity or PCL laxity.     Left knee: Normal.     Right lower leg: No edema.  Neurological:     Mental  Status: He is alert.     Assessment & Plan:   Problem List Items Addressed This Visit      Cardiovascular and Mediastinum   Essential hypertension - Primary    BP Readings from Last 3 Encounters:  07/06/19 139/89  07/02/19 (!) 187/114  12/16/18 (!) 170/90   BP improved today, he is not taking his 10mg  amlodipine currently only took one pill after ED visit.  He is concerned about how it made him feel.    -he is agreeable to start at 5mg  amlodipine today      Relevant Medications   aspirin EC 81 MG tablet   amLODipine (NORVASC) 5 MG tablet   rosuvastatin (CRESTOR) 20 MG tablet     Other   History of CVA (cerebrovascular accident)    Not currently on stroke therapies.  Minimal residual deficits but does admit trouble with balance.  On chart review multiple accidents/ED visits.     -repeat LDL -add crestor 20, asa 81mg , continue amlodipine but at 5mg  -physical therapy to address balance issues      Relevant Medications   aspirin EC 81 MG tablet   rosuvastatin (CRESTOR) 20 MG tablet   Other Relevant Orders   Ambulatory referral to Physical Therapy   Tobacco use disorder    Discussed importance of smoking cessation.  He understands but is not ready to quit at this time.        Hyperlipidemia    Lab Results  Component Value Date   CHOL 242 (H) 07/06/2019   HDL 84 07/06/2019   LDLCALC 132 (H) 07/06/2019   TRIG 148 07/06/2019   CHOLHDL 2.9 07/06/2019   LDL elevated above goal for pts with CVA.    -start crestor 20mg       Relevant Medications   aspirin EC 81 MG tablet   amLODipine (NORVASC) 5 MG tablet   rosuvastatin (CRESTOR) 20 MG tablet   Acute pain of right knee    Pain and swelling improved.  He is using NSAIDS, ice and compression therapy. I doubt he has had any significant ligamental tears based on exam. He is taking ibuprofen currently. I think it will be good to establish with orthopedic surgery as he will likely need knee replacement at some point and  can explore options to limit his NSAID therapy.    -physical therapy -he will keep his orthopedic referral -continue supportive care  Relevant Orders   Ambulatory referral to Physical Therapy      Outpatient Encounter Medications as of 07/06/2019  Medication Sig  . amLODipine (NORVASC) 5 MG tablet Take 1 tablet (5 mg total) by mouth daily.  Marland Kitchen aspirin EC 81 MG tablet Take 1 tablet (81 mg total) by mouth daily.  Marland Kitchen ibuprofen (ADVIL,MOTRIN) 600 MG tablet Take 1 tablet (600 mg total) by mouth every 6 (six) hours as needed for pain.  . rosuvastatin (CRESTOR) 20 MG tablet Take 1 tablet (20 mg total) by mouth daily.  . [DISCONTINUED] amLODipine (NORVASC) 10 MG tablet Take 1 tablet (10 mg total) by mouth daily.  . [DISCONTINUED] amLODipine (NORVASC) 5 MG tablet Take 1 tablet (5 mg total) by mouth daily.  . [DISCONTINUED] aspirin EC 81 MG tablet Take 1 tablet (81 mg total) by mouth daily.  . [DISCONTINUED] naproxen (NAPROSYN) 375 MG tablet Take 1 tablet (375 mg total) by mouth 2 (two) times daily.  . [DISCONTINUED] naproxen sodium (ALEVE) 220 MG tablet Take 220 mg by mouth 2 (two) times daily as needed (pain).   No facility-administered encounter medications on file as of 07/06/2019.    Follow-up: Return in about 7 weeks (around 08/24/2019) for bp follow up and HLD follow up.   Thornell Mule, MD

## 2019-07-07 ENCOUNTER — Encounter: Payer: Self-pay | Admitting: Internal Medicine

## 2019-07-07 DIAGNOSIS — M25561 Pain in right knee: Secondary | ICD-10-CM | POA: Insufficient documentation

## 2019-07-07 DIAGNOSIS — E785 Hyperlipidemia, unspecified: Secondary | ICD-10-CM | POA: Insufficient documentation

## 2019-07-07 LAB — LIPID PANEL
Chol/HDL Ratio: 2.9 ratio (ref 0.0–5.0)
Cholesterol, Total: 242 mg/dL — ABNORMAL HIGH (ref 100–199)
HDL: 84 mg/dL (ref >39)
LDL Chol Calc (NIH): 132 mg/dL — ABNORMAL HIGH (ref 0–99)
Triglycerides: 148 mg/dL (ref 0–149)
VLDL Cholesterol Cal: 26 mg/dL (ref 5–40)

## 2019-07-07 MED ORDER — ROSUVASTATIN CALCIUM 20 MG PO TABS: 20.0000 mg | ORAL_TABLET | Freq: Every day | ORAL | 0 refills | Status: DC

## 2019-07-07 NOTE — Assessment & Plan Note (Signed)
BP Readings from Last 3 Encounters:  07/06/19 139/89  07/02/19 (!) 187/114  12/16/18 (!) 170/90   BP improved today, he is not taking his 10mg  amlodipine currently only took one pill after ED visit.  He is concerned about how it made him feel.    -he is agreeable to start at 5mg  amlodipine today

## 2019-07-07 NOTE — Assessment & Plan Note (Signed)
Lab Results  Component Value Date   CHOL 242 (H) 07/06/2019   HDL 84 07/06/2019   LDLCALC 132 (H) 07/06/2019   TRIG 148 07/06/2019   CHOLHDL 2.9 07/06/2019   LDL elevated above goal for pts with CVA.    -start crestor 20mg 

## 2019-07-07 NOTE — Assessment & Plan Note (Signed)
Not currently on stroke therapies.  Minimal residual deficits but does admit trouble with balance.  On chart review multiple accidents/ED visits.     -repeat LDL -add crestor 20, asa 81mg , continue amlodipine but at 5mg  -physical therapy to address balance issues

## 2019-07-07 NOTE — Assessment & Plan Note (Signed)
Pain and swelling improved.  He is using NSAIDS, ice and compression therapy. I doubt he has had any significant ligamental tears based on exam. He is taking ibuprofen currently. I think it will be good to establish with orthopedic surgery as he will likely need knee replacement at some point and can explore options to limit his NSAID therapy.    -physical therapy -he will keep his orthopedic referral -continue supportive care

## 2019-07-07 NOTE — Assessment & Plan Note (Signed)
Discussed importance of smoking cessation.  He understands but is not ready to quit at this time.

## 2019-07-12 NOTE — Progress Notes (Signed)
Internal Medicine Clinic Attending  Case discussed with Dr. Winfrey  at the time of the visit.  We reviewed the resident's history and exam and pertinent patient test results.  I agree with the assessment, diagnosis, and plan of care documented in the resident's note.  

## 2019-08-02 ENCOUNTER — Other Ambulatory Visit: Payer: Self-pay | Admitting: Internal Medicine

## 2019-08-02 DIAGNOSIS — I10 Essential (primary) hypertension: Secondary | ICD-10-CM

## 2019-08-02 DIAGNOSIS — E785 Hyperlipidemia, unspecified: Secondary | ICD-10-CM

## 2019-08-02 DIAGNOSIS — I639 Cerebral infarction, unspecified: Secondary | ICD-10-CM

## 2019-08-02 NOTE — Telephone Encounter (Signed)
Needs appt HTN F/U next 90 days with new PCP

## 2019-08-03 ENCOUNTER — Other Ambulatory Visit: Payer: Self-pay

## 2019-08-03 ENCOUNTER — Ambulatory Visit: Payer: BC Managed Care – PPO | Admitting: Physical Therapy

## 2019-08-03 ENCOUNTER — Encounter: Payer: Self-pay | Admitting: Physical Therapy

## 2019-08-03 VITALS — BP 149/92 | HR 61

## 2019-08-03 NOTE — Therapy (Signed)
[facility] [address] [place], [place], [address] Phone: [phone]   Fax:  [phone]  Patient Details  Name: Shane Franklin MRN: [id number] Date of Birth: [birth year] Referring Provider:  Burns Spain, MD   Physical Therapy Evaluation - Arrived No Charge   Encounter Date: 08/03/2019   Vitals:   08/03/19 1503  BP: (!) 149/92  Pulse: 61     08/03/19 1450  Symptoms/Limitations  Subjective Had a fall at work on his R knee on 07/02/19. The swelling has gone down. If he does any sideways type strain on the knee it hurts. Bending it too far will cause pain. 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 Back at work.  Wearing a compression bandage and a knee brace.  Pertinent History hx of CVA, polysubstance abuse, HTN, HLD  Patient Stated Goals "wants to find out more whats going on with his knee"  Pain Assessment  Currently in Pain? No/denies     Patient had a fall at work on 07/02/19 and went to ER due to R knee pain. Per ER discharge instructions, pt was instruction to go to an Orthopedic Urgent care for further evaluation due to pt potentially injuring a ligament in his knee and for pt to take further instruction from Orthopedic Urgent care. Pt reports he has not yet gone to an Orthopedic Urgent care regarding his R knee - as pt continues to have pain on medial aspect of knee at and above the joint line, limps with gait, and incr pain with valgus stress to the knee. Printed out the patient instructions from pt's ER visit and address and phone number of urgent care clinic for pt to make appointment. Discussed for pt to get further instruction after visit and that pt would potentially benefit from an orthopedic PT referral vs. Neuro rehab due to R knee being primary compliant. Pt verbalized understanding.    Drake Leach, PT,DPT  08/03/2019, 8:54 PM  [facility] [address] [place], [place], [address] Phone: [phone]   Fax:  [phone]

## 2019-11-11 ENCOUNTER — Telehealth: Payer: Self-pay | Admitting: *Deleted

## 2019-11-11 ENCOUNTER — Encounter: Payer: BC Managed Care – PPO | Admitting: Internal Medicine

## 2019-11-11 NOTE — Telephone Encounter (Signed)
Call to mpatient about missed appointment.  Appointment rescheduled.  Angelina Ok, RN 11/11/2019 2:25 PM.

## 2019-11-18 ENCOUNTER — Ambulatory Visit (INDEPENDENT_AMBULATORY_CARE_PROVIDER_SITE_OTHER): Payer: BC Managed Care – PPO | Admitting: Internal Medicine

## 2019-11-18 ENCOUNTER — Encounter: Payer: BC Managed Care – PPO | Admitting: Internal Medicine

## 2019-11-18 ENCOUNTER — Other Ambulatory Visit: Payer: Self-pay | Admitting: Internal Medicine

## 2019-11-18 ENCOUNTER — Encounter: Payer: Self-pay | Admitting: Internal Medicine

## 2019-11-18 ENCOUNTER — Other Ambulatory Visit: Payer: Self-pay

## 2019-11-18 DIAGNOSIS — E785 Hyperlipidemia, unspecified: Secondary | ICD-10-CM

## 2019-11-18 DIAGNOSIS — Z0001 Encounter for general adult medical examination with abnormal findings: Secondary | ICD-10-CM | POA: Diagnosis not present

## 2019-11-18 DIAGNOSIS — F17200 Nicotine dependence, unspecified, uncomplicated: Secondary | ICD-10-CM

## 2019-11-18 DIAGNOSIS — I639 Cerebral infarction, unspecified: Secondary | ICD-10-CM

## 2019-11-18 DIAGNOSIS — F17210 Nicotine dependence, cigarettes, uncomplicated: Secondary | ICD-10-CM

## 2019-11-18 DIAGNOSIS — M25561 Pain in right knee: Secondary | ICD-10-CM

## 2019-11-18 DIAGNOSIS — I10 Essential (primary) hypertension: Secondary | ICD-10-CM | POA: Diagnosis not present

## 2019-11-18 MED ORDER — AMLODIPINE BESYLATE 5 MG PO TABS: ORAL_TABLET | ORAL | 0 refills | Status: DC

## 2019-11-18 MED ORDER — AMLODIPINE BESYLATE 5 MG PO TABS: 5.0000 mg | ORAL_TABLET | Freq: Every day | ORAL | 11 refills | Status: AC

## 2019-11-18 MED ORDER — ASPIRIN EC 81 MG PO TBEC: 81.0000 mg | DELAYED_RELEASE_TABLET | Freq: Every day | ORAL | 2 refills | Status: AC

## 2019-11-18 MED ORDER — ROSUVASTATIN CALCIUM 20 MG PO TABS: ORAL_TABLET | ORAL | 0 refills | Status: DC

## 2019-11-18 MED ORDER — ASPIRIN EC 81 MG PO TBEC: 81.0000 mg | DELAYED_RELEASE_TABLET | Freq: Every day | ORAL | 1 refills | Status: DC

## 2019-11-18 MED ORDER — ROSUVASTATIN CALCIUM 20 MG PO TABS: 20.0000 mg | ORAL_TABLET | Freq: Every day | ORAL | 11 refills | Status: AC

## 2019-11-18 NOTE — Patient Instructions (Addendum)
Shane Franklin,  It was a pleasure meeting you today! Today we discussed your cholesterol, blood pressure, and orthopedic referral. We will restart your medications after today's encounter, and check your blood work for your cholesterol. We will also put in an ortho consult for your knee pain today as well. We will follow up with you in 1-2 month(s). It was a pleasure working with you!  Sincerely,   Dolan Amen, MD

## 2019-11-18 NOTE — Progress Notes (Signed)
   CC: Blood pressure and cholesterol  HPI:  [name] is a 64 y.o. M with PMHx listed below presenting for HTN and HLD follow up. Please see the A&P for the status of the patient's chronic medical problems.  Past Medical History:  Diagnosis Date  . Murmur   . Weakness of one side of body 08/18/2011   Review of Systems:   Review of Systems  Constitutional: Negative for chills, fever and weight loss.  Cardiovascular: Negative for chest pain and palpitations.  Gastrointestinal: Negative for abdominal pain, constipation, diarrhea, nausea and vomiting.  Musculoskeletal: Positive for joint pain.       Knee pain    Physical Exam:  Vitals:   11/18/19 1511  BP: (!) 149/89  Pulse: 71  Temp: 97.7 F (36.5 C)  TempSrc: Oral  SpO2: 100%  Weight: 164 lb 14.4 oz (74.8 kg)  Height: 5\' 9"  (1.753 m)   Physical Exam Vitals and nursing note reviewed.  Constitutional:      General: He is not in acute distress.    Appearance: Normal appearance. He is not ill-appearing or toxic-appearing.  HENT:     Head: Normocephalic and atraumatic.  Eyes:     General:        Right eye: No discharge.        Left eye: No discharge.     Conjunctiva/sclera: Conjunctivae normal.  Cardiovascular:     Rate and Rhythm: Normal rate and regular rhythm.     Pulses: Normal pulses.     Heart sounds: Normal heart sounds. No murmur heard.  No friction rub. No gallop.   Pulmonary:     Effort: Pulmonary effort is normal.     Breath sounds: Normal breath sounds. No wheezing, rhonchi or rales.  Abdominal:     General: Bowel sounds are normal.     Palpations: Abdomen is soft.     Tenderness: There is no abdominal tenderness. There is no guarding.  Neurological:     Mental Status: He is alert and oriented to person, place, and time.     Assessment & Plan:   See Encounters Tab for problem based charting.  Patient discussed with Dr. 

## 2019-11-19 ENCOUNTER — Encounter: Payer: Self-pay | Admitting: Internal Medicine

## 2019-11-19 LAB — LIPID PANEL
Chol/HDL Ratio: 2.7 ratio (ref 0.0–5.0)
Cholesterol, Total: 239 mg/dL — ABNORMAL HIGH (ref 100–199)
HDL: 88 mg/dL (ref >39)
LDL Chol Calc (NIH): 125 mg/dL — ABNORMAL HIGH (ref 0–99)
Triglycerides: 152 mg/dL — ABNORMAL HIGH (ref 0–149)
VLDL Cholesterol Cal: 26 mg/dL (ref 5–40)

## 2019-11-19 LAB — BMP8+ANION GAP
Anion Gap: 15 mmol/L (ref 10.0–18.0)
BUN/Creatinine Ratio: 14 (ref 10–24)
BUN: 16 mg/dL (ref 8–27)
CO2: 25 mmol/L (ref 20–29)
Calcium: 9.5 mg/dL (ref 8.6–10.2)
Chloride: 101 mmol/L (ref 96–106)
Creatinine, Ser: 1.17 mg/dL (ref 0.76–1.27)
GFR calc Af Amer: 76 mL/min/{1.73_m2} (ref >59)
GFR calc non Af Amer: 66 mL/min/{1.73_m2} (ref >59)
Glucose: 88 mg/dL (ref 65–99)
Potassium: 4.2 mmol/L (ref 3.5–5.2)
Sodium: 141 mmol/L (ref 134–144)

## 2019-11-19 NOTE — Assessment & Plan Note (Signed)
Patient admits to not being adherent to his medical regimen, his Crestor.  His Lipid panel today is:  Lipid Panel     Component Value Date/Time   CHOL 239 (H) 11/18/2019 1555   TRIG 152 (H) 11/18/2019 1555   HDL 88 11/18/2019 1555   CHOLHDL 2.7 11/18/2019 1555   CHOLHDL 2.0 08/18/2011 0520   VLDL 14 08/18/2011 0520   LDLCALC 125 (H) 11/18/2019 1555   LABVLDL 26 11/18/2019 1555   Discussed need to take medication daily for future stroke prevention. Patient voiced agreement and will start taking his Crestor.  - Continue Crestor 20 mg QD

## 2019-11-19 NOTE — Assessment & Plan Note (Signed)
Patient continues to smoke approximately a pack a day. Emphasized importance of smoking cessation. Patient voices understanding, but is not ready to quit at this time.

## 2019-11-19 NOTE — Assessment & Plan Note (Signed)
Patient comes to the office with continued R knee pain, currently taking tylenol, occasional NSAIDs, heat and ice to control his pain. He did not keep his orthopedic referral, stating that he missed the appointment. He asks if another referral can be sent. - Continue conservative therapy.  - Referral to orthopedics

## 2019-11-19 NOTE — Assessment & Plan Note (Signed)
Patient admits to not being adherent to medical regimen of amlodipine 5 mg. His BP today is  Vitals with BMI 11/18/2019 08/03/2019 07/06/2019  Height 5\' 9"  - 5\' 9"   Weight 164 lbs 14 oz - 168 lbs 2 oz  BMI 24.34 - 24.81  Systolic 149 149  Diastolic 89 92 89  Pulse 71 61 83  Discussed with patient about future risk associated with uncontrolled HTN. Patient voiced understanding, and will restart his home medications.  - Amlodipine 5 mg QD - Check compliance at next visit.

## 2019-11-21 NOTE — Progress Notes (Signed)
Internal Medicine Clinic Attending ? ?Case discussed with Dr. Winters  At the time of the visit.  We reviewed the resident?s history and exam and pertinent patient test results.  I agree with the assessment, diagnosis, and plan of care documented in the resident?s note.  ?

## 2019-12-08 ENCOUNTER — Other Ambulatory Visit: Payer: Self-pay

## 2019-12-08 ENCOUNTER — Ambulatory Visit (INDEPENDENT_AMBULATORY_CARE_PROVIDER_SITE_OTHER): Payer: BC Managed Care – PPO | Admitting: Orthopaedic Surgery

## 2019-12-08 ENCOUNTER — Ambulatory Visit: Payer: Self-pay

## 2019-12-08 DIAGNOSIS — M25561 Pain in right knee: Secondary | ICD-10-CM

## 2019-12-08 MED ORDER — LIDOCAINE HCL 1 % IJ SOLN
3.0000 mL | INTRAMUSCULAR | Status: AC | PRN
  Administered 2019-12-08: 3 mL

## 2019-12-08 MED ORDER — METHYLPREDNISOLONE ACETATE 40 MG/ML IJ SUSP
40.0000 mg | INTRAMUSCULAR | Status: AC | PRN
  Administered 2019-12-08: 40 mg via INTRA_ARTICULAR

## 2019-12-08 NOTE — Progress Notes (Signed)
Office Visit Note   Patient: Shane Franklin           Date of Birth: 05-26-1955           MRN: [id number] Visit Date: 12/08/2019              Requested by: Inez Catalina, MD [address] [place],  [place] [address] PCP: Dolan Amen, MD   Assessment & Plan: Visit Diagnoses:  1. Right knee pain, unspecified chronicity     Plan: Based on his clinical exam as well as signs and symptoms, I am concerned that he has a torn meniscus of the right knee with medial compartment.  I did place a steroid in his knee today to decrease his symptoms of pain.  He should try to avoid significant activity with that knee.  I would like to obtain an MRI of the right knee rule out a meniscal tear based on his clinical exam and signs and symptoms.  All questions and concerns were answered and addressed.  Follow-Up Instructions: Return in about 2 weeks (around 12/22/2019).   Orders:  Orders Placed This Encounter  Procedures  . Large Joint Inj  . XR Knee 1-2 Views Right   No orders of the defined types were placed in this encounter.     Procedures: Large Joint Inj: R knee on 12/08/2019 1:17 PM Indications: diagnostic evaluation and pain Details: 22 G 1.5 in needle, superolateral approach  Arthrogram: No  Medications: 3 mL lidocaine 1 %; 40 mg methylPREDNISolone acetate 40 MG/ML Outcome: tolerated well, no immediate complications Procedure, treatment alternatives, risks and benefits explained, specific risks discussed. Consent was given by the patient. Immediately prior to procedure a time out was called to verify the correct patient, procedure, equipment, support staff and site/side marked as required. Patient was prepped and draped in the usual sterile fashion.       Clinical Data: No additional findings.   Subjective: Chief Complaint  Patient presents with  . Right Knee - Pain  The patient is a very pleasant 64 year old gentleman with no previous right knee issues who twisted his right  knee at work a month ago.  He developed swelling immediately after that and since then the swelling has come down but he has had a lot of locking catching and pain with that right knee.  Pivoting activities also cause locking catching with the right knee and has become quite painful.  He is walking with a limp as well.  He has never had any issues with the knee before.  He does smoke.  He denies being a diabetic.  HPI  Review of Systems He is very active and works hard.  He currently denies any headache, chest pain, shortness of breath, fever, chills, nausea, vomiting Objective: Vital Signs: There were no vitals taken for this visit.  Physical Exam He is alert and orient x3 and in no acute distress Ortho Exam Examination of his right knee shows no effusion.  There is a positive McMurray's exam to the medial compartment of the knee.  There is significant medial joint line tenderness left hip with the tibia on the femur he has a lot of pain lifts up out of his chair due to pain in the medial aspect of his knee.  Flexion past 90 degrees also causes medial knee pain.  His Lachman's is negative. Specialty Comments:  No specialty comments available.  Imaging: XR Knee 1-2 Views Right  Result Date: 12/08/2019 2 views of the  right knee show no acute findings.  The joint space is well-maintained.    PMFS History: Patient Active Problem List   Diagnosis Date Noted  . Hyperlipidemia 07/07/2019  . Acute pain of right knee 07/07/2019  . Tobacco use disorder 07/06/2019  . Essential hypertension 07/06/2019  . History of CVA (cerebrovascular accident) 08/18/2011  . Polysubstance abuse (HCC) 08/18/2011   Past Medical History:  Diagnosis Date  . Murmur   . Weakness of one side of body 08/18/2011    Family History  Problem Relation Age of Onset  . Stroke Mother   . Cancer Mother   . Hypertension Mother     No past surgical history on file. Social History   Occupational History  . Not on  file  Tobacco Use  . Smoking status: Current Every Day Smoker    Packs/day: 1.00  . Smokeless tobacco: Never Used  Substance and Sexual Activity  . Alcohol use: Yes    Comment: every other day either wine cooler or liquor  . Drug use: Yes    Types: Cocaine, Marijuana    Comment: daily  . Sexual activity: Not on file    Comment: none tonight

## 2019-12-22 ENCOUNTER — Ambulatory Visit: Payer: BC Managed Care – PPO | Admitting: Orthopaedic Surgery

## 2019-12-23 ENCOUNTER — Other Ambulatory Visit: Payer: BC Managed Care – PPO

## 2020-02-09 ENCOUNTER — Encounter: Payer: BC Managed Care – PPO | Admitting: Internal Medicine

## 2020-02-10 ENCOUNTER — Encounter: Payer: BC Managed Care – PPO | Admitting: Internal Medicine

## 2020-02-10 ENCOUNTER — Telehealth: Payer: Self-pay | Admitting: *Deleted

## 2020-02-10 NOTE — Telephone Encounter (Signed)
Placed call to patient for missed appt. Recording states call cannot be completed at this time. Kinnie Feil, BSN, RN-BC

## 2020-04-14 DIAGNOSIS — Z20822 Contact with and (suspected) exposure to covid-19: Secondary | ICD-10-CM | POA: Diagnosis not present

## 2020-04-14 DIAGNOSIS — Z03818 Encounter for observation for suspected exposure to other biological agents ruled out: Secondary | ICD-10-CM | POA: Diagnosis not present

## 2020-04-21 DIAGNOSIS — Z20822 Contact with and (suspected) exposure to covid-19: Secondary | ICD-10-CM | POA: Diagnosis not present

## 2020-04-21 DIAGNOSIS — Z03818 Encounter for observation for suspected exposure to other biological agents ruled out: Secondary | ICD-10-CM | POA: Diagnosis not present

## 2020-04-28 DIAGNOSIS — Z03818 Encounter for observation for suspected exposure to other biological agents ruled out: Secondary | ICD-10-CM | POA: Diagnosis not present

## 2020-04-28 DIAGNOSIS — Z20822 Contact with and (suspected) exposure to covid-19: Secondary | ICD-10-CM | POA: Diagnosis not present

## 2020-05-29 ENCOUNTER — Telehealth: Payer: Self-pay

## 2020-05-29 NOTE — Telephone Encounter (Signed)
Return pt's call - no answer; "call cannot be completed at this time".

## 2020-05-29 NOTE — Telephone Encounter (Signed)
Please call pt back about having headache.

## 2020-05-30 ENCOUNTER — Ambulatory Visit (INDEPENDENT_AMBULATORY_CARE_PROVIDER_SITE_OTHER): Payer: BC Managed Care – PPO | Admitting: Internal Medicine

## 2020-05-30 ENCOUNTER — Other Ambulatory Visit: Payer: Self-pay

## 2020-05-30 ENCOUNTER — Encounter: Payer: Self-pay | Admitting: Internal Medicine

## 2020-05-30 VITALS — BP 148/88 | HR 82 | Temp 98.9°F | Ht 69.0 in | Wt 162.2 lb

## 2020-05-30 DIAGNOSIS — M542 Cervicalgia: Secondary | ICD-10-CM

## 2020-05-30 DIAGNOSIS — I10 Essential (primary) hypertension: Secondary | ICD-10-CM

## 2020-05-30 MED ORDER — ACETAMINOPHEN 500 MG PO TABS: 500.0000 mg | ORAL_TABLET | Freq: Four times a day (QID) | ORAL | 0 refills | Status: AC | PRN

## 2020-05-30 NOTE — Patient Instructions (Signed)
To Mr. Holan,  It was a pleasure to see you again! Today we talked about your headache, it appears along the lines of musculoskeletal pain. We will increase your tylenol dose today, and I would continue to stretch your neck. I will have you follow up in two weeks. For reevaluation.  Dolan Amen, MD

## 2020-05-30 NOTE — Progress Notes (Signed)
   CC: Headache  HPI:  [name] is a 65 y.o. M/F, with a PMH noted below, who presents to the clinic headache. To see the management of their acute and chronic conditions, please see the A&P note under the Encounters tab.   Past Medical History:  Diagnosis Date  . Murmur   . Weakness of one side of body 08/18/2011   Review of Systems:   Review of Systems  Constitutional: Negative for chills, fever, malaise/fatigue and weight loss.  HENT: Negative for congestion, ear pain, hearing loss, nosebleeds and tinnitus.   Eyes: Negative for blurred vision, double vision, photophobia, pain, discharge and redness.  Cardiovascular: Negative for chest pain and palpitations.  Gastrointestinal: Negative for abdominal pain, constipation, diarrhea, nausea and vomiting.  Musculoskeletal: Positive for neck pain. Negative for back pain, falls and joint pain.  Neurological: Positive for headaches. Negative for dizziness, tingling, tremors, sensory change, speech change, focal weakness and weakness.     Physical Exam:  Vitals:   05/30/20 1451  BP: (!) 148/88  Pulse: 82  Temp: 98.9 F (37.2 C)  TempSrc: Oral  SpO2: 98%  Weight: 162 lb 3.2 oz (73.6 kg)  Height: 5\' 9"  (1.753 m)   Physical Exam Constitutional:      General: He is not in acute distress.    Appearance: He is well-developed and normal weight. He is not ill-appearing.  HENT:     Head: Normocephalic and atraumatic.  Eyes:     General: No scleral icterus.    Extraocular Movements: Extraocular movements intact.     Right eye: Normal extraocular motion and no nystagmus.     Left eye: Normal extraocular motion and no nystagmus.     Pupils: Pupils are equal, round, and reactive to light. Pupils are equal.     Right eye: Pupil is round and reactive.     Left eye: Pupil is round and reactive.  Neck:     Comments: Tenderness to palpation along the L trapezius  Cardiovascular:     Rate and Rhythm: Normal rate and regular rhythm.      Heart sounds: Normal heart sounds. No murmur heard. No friction rub. No gallop.   Pulmonary:     Effort: Pulmonary effort is normal.     Breath sounds: Normal breath sounds. No wheezing, rhonchi or rales.  Abdominal:     General: Bowel sounds are normal.  Musculoskeletal:        General: Tenderness present. No swelling.     Comments: Tenderness to palpation along the L trapezius   Lymphadenopathy:     Cervical: No cervical adenopathy.  Skin:    General: Skin is warm.  Neurological:     Mental Status: He is alert and oriented to person, place, and time. Mental status is at baseline.     Cranial Nerves: No cranial nerve deficit or facial asymmetry.     Motor: No weakness.     Gait: Gait normal.  Psychiatric:        Mood and Affect: Mood normal.        Behavior: Behavior normal.      Assessment & Plan:   See Encounters Tab for problem based charting.  Patient discussed with Dr. 

## 2020-05-31 ENCOUNTER — Encounter: Payer: Self-pay | Admitting: Internal Medicine

## 2020-05-31 DIAGNOSIS — M542 Cervicalgia: Secondary | ICD-10-CM | POA: Insufficient documentation

## 2020-05-31 LAB — BMP8+ANION GAP
Anion Gap: 14 mmol/L (ref 10.0–18.0)
BUN/Creatinine Ratio: 12 (ref 10–24)
BUN: 15 mg/dL (ref 8–27)
CO2: 24 mmol/L (ref 20–29)
Calcium: 9.4 mg/dL (ref 8.6–10.2)
Chloride: 102 mmol/L (ref 96–106)
Creatinine, Ser: 1.21 mg/dL (ref 0.76–1.27)
Glucose: 101 mg/dL — ABNORMAL HIGH (ref 65–99)
Potassium: 5.4 mmol/L — ABNORMAL HIGH (ref 3.5–5.2)
Sodium: 140 mmol/L (ref 134–144)
eGFR: 67 mL/min/{1.73_m2} (ref >59)

## 2020-05-31 NOTE — Progress Notes (Signed)
Internal Medicine Clinic Attending ? ?Case discussed with Dr. Winters  At the time of the visit.  We reviewed the resident?s history and exam and pertinent patient test results.  I agree with the assessment, diagnosis, and plan of care documented in the resident?s note.  ?

## 2020-05-31 NOTE — Assessment & Plan Note (Signed)
Patient presents to the clinic with 4 days of a self reported headache. He states that his symptoms started on 3/18. That morning we was working on a collapsed fence post and was lifting and positioning posts from his truck. His symptoms started that afternoon after waking up from a nap. He states that his headache feels like "cramping" on the left side of his head and travels down to the base of his neck.   It is worsened by turning to face the left side. His significant other was giving him a massage which aggravated his headache when she reached his neck.   Aleve provided some relief for his symptoms.   He denies vision changes, photophobia, auras, fevers, nausea, pain while chewing, or vomiting.   A/P:  Patient presents with a headache that appears to be MSK strain 2/2 to fixing a collapsed. On physical examination, patient has pain when turning his head to the left, and there is tenderness to palpation along the trapezius to the occiput. Neuro exam is reassuring and there are no deficits present.  - Tylenol 500 mg Q6H PRN - Neck exercises and heating pad - Follow up in 2 weeks - Will get BMP to assess renal function, if renal function allows consider NSAID therapy if resistant to tylenol.

## 2020-06-13 ENCOUNTER — Encounter: Payer: BC Managed Care – PPO | Admitting: Student

## 2020-06-15 ENCOUNTER — Telehealth: Payer: Self-pay | Admitting: *Deleted

## 2020-06-15 ENCOUNTER — Encounter: Payer: BC Managed Care – PPO | Admitting: Student

## 2020-06-15 NOTE — Telephone Encounter (Signed)
Call to patient about missed appointment today.  Got busy at work.  Will call back later to reschedule appointment.  Angelina Ok, RN 06/15/2020 4:22 PM

## 2020-07-15 ENCOUNTER — Emergency Department (HOSPITAL_COMMUNITY)
Admission: EM | Admit: 2020-07-15 | Discharge: 2020-07-15 | Disposition: A | Discharge status: Home / Self Care | Payer: BC Managed Care – PPO | Attending: Emergency Medicine | Admitting: Emergency Medicine

## 2020-07-15 ENCOUNTER — Encounter (HOSPITAL_COMMUNITY): Payer: Self-pay | Admitting: Emergency Medicine

## 2020-07-15 ENCOUNTER — Emergency Department (HOSPITAL_COMMUNITY): Payer: BC Managed Care – PPO

## 2020-07-15 DIAGNOSIS — R0602 Shortness of breath: Secondary | ICD-10-CM | POA: Diagnosis not present

## 2020-07-15 DIAGNOSIS — F17210 Nicotine dependence, cigarettes, uncomplicated: Secondary | ICD-10-CM | POA: Insufficient documentation

## 2020-07-15 DIAGNOSIS — Z79899 Other long term (current) drug therapy: Secondary | ICD-10-CM | POA: Insufficient documentation

## 2020-07-15 DIAGNOSIS — I10 Essential (primary) hypertension: Secondary | ICD-10-CM | POA: Diagnosis not present

## 2020-07-15 DIAGNOSIS — M549 Dorsalgia, unspecified: Secondary | ICD-10-CM | POA: Diagnosis not present

## 2020-07-15 DIAGNOSIS — Z7982 Long term (current) use of aspirin: Secondary | ICD-10-CM | POA: Insufficient documentation

## 2020-07-15 DIAGNOSIS — M62830 Muscle spasm of back: Secondary | ICD-10-CM | POA: Diagnosis not present

## 2020-07-15 DIAGNOSIS — R001 Bradycardia, unspecified: Secondary | ICD-10-CM | POA: Diagnosis not present

## 2020-07-15 DIAGNOSIS — J449 Chronic obstructive pulmonary disease, unspecified: Secondary | ICD-10-CM | POA: Diagnosis not present

## 2020-07-15 DIAGNOSIS — M25511 Pain in right shoulder: Secondary | ICD-10-CM | POA: Diagnosis not present

## 2020-07-15 MED ORDER — METHOCARBAMOL 500 MG PO TABS
500.0000 mg | ORAL_TABLET | Freq: Once | ORAL | Status: AC
  Administered 2020-07-15: 500 mg via ORAL
  Filled 2020-07-15: qty 1

## 2020-07-15 MED ORDER — DICLOFENAC SODIUM 1 % EX GEL: 2.0000 g | Freq: Four times a day (QID) | CUTANEOUS | 0 refills | Status: AC

## 2020-07-15 MED ORDER — METHOCARBAMOL 500 MG PO TABS: 500.0000 mg | ORAL_TABLET | Freq: Two times a day (BID) | ORAL | 0 refills | Status: AC

## 2020-07-15 MED ORDER — ACETAMINOPHEN 325 MG PO TABS
650.0000 mg | ORAL_TABLET | Freq: Once | ORAL | Status: AC
  Administered 2020-07-15: 650 mg via ORAL
  Filled 2020-07-15: qty 2

## 2020-07-15 NOTE — ED Provider Notes (Signed)
[facility] EMERGENCY DEPARTMENT Provider Note   CSN: [id number] Arrival date & time: 07/15/20  1052     History Chief Complaint  Patient presents with  . Back Pain    Shane Franklin is a 65 y.o. male with a past medical history of prior stroke, tobacco use, polysubstance abuse presenting to the ED with a chief complaint of right-sided posterior shoulder pain since waking up this morning.  States that last night he was.  Moving an old TV upstairs which he states he has not done in a while.  He woke up this morning with this right-sided posterior shoulder pain that is worse with movement.  He tried taking Aleve and muscle rub in the area with some improvement in symptoms.  States that sometimes he takes a deep breath the pain worsens.  Denies any chest pain or shortness of breath at rest.  No injuries or falls.  Denies any numbness or weakness.  No prior shoulder or back surgeries.  HPI     Past Medical History:  Diagnosis Date  . Murmur   . Weakness of one side of body 08/18/2011    Patient Active Problem List   Diagnosis Date Noted  . Neck pain, musculoskeletal 05/31/2020  . Hyperlipidemia 07/07/2019  . Acute pain of right knee 07/07/2019  . Tobacco use disorder 07/06/2019  . Essential hypertension 07/06/2019  . History of CVA (cerebrovascular accident) 08/18/2011  . Polysubstance abuse (HCC) 08/18/2011    History reviewed. No pertinent surgical history.     Family History  Problem Relation Age of Onset  . Stroke Mother   . Cancer Mother   . Hypertension Mother     Social History   Tobacco Use  . Smoking status: Current Every Day Smoker    Packs/day: 1.00  . Smokeless tobacco: Never Used  Substance Use Topics  . Alcohol use: Yes    Comment: every other day either wine cooler or liquor  . Drug use: Yes    Types: Cocaine, Marijuana    Comment: daily    Home Medications Prior to Admission medications   Medication Sig Start Date End Date  Taking? Authorizing Provider  diclofenac Sodium (VOLTAREN) 1 % GEL Apply 2 g topically 4 (four) times daily. 07/15/20  Yes Amunique Neyra, PA-C  methocarbamol (ROBAXIN) 500 MG tablet Take 1 tablet (500 mg total) by mouth 2 (two) times daily. 07/15/20  Yes Luisantonio Adinolfi, PA-C  acetaminophen (TYLENOL) 500 MG tablet Take 1 tablet (500 mg total) by mouth every 6 (six) hours as needed for headache. 05/30/20 05/30/21  Dolan Amen, MD  amLODipine (NORVASC) 5 MG tablet Take 1 tablet (5 mg total) by mouth daily. 11/18/19 11/17/20  Dolan Amen, MD  aspirin EC 81 MG tablet Take 1 tablet (81 mg total) by mouth daily. Swallow whole. 11/18/19 11/17/20  Dolan Amen, MD  rosuvastatin (CRESTOR) 20 MG tablet Take 1 tablet (20 mg total) by mouth daily. 11/18/19 11/17/20  Dolan Amen, MD    Allergies    Patient has no known allergies.  Review of Systems   Review of Systems  Constitutional: Negative for appetite change, chills and fever.  HENT: Negative for ear pain, rhinorrhea, sneezing and sore throat.   Eyes: Negative for photophobia and visual disturbance.  Respiratory: Negative for cough, chest tightness, shortness of breath and wheezing.   Cardiovascular: Negative for chest pain and palpitations.  Gastrointestinal: Negative for abdominal pain, blood in stool, constipation, diarrhea, nausea and vomiting.  Genitourinary: Negative for  dysuria, hematuria and urgency.  Musculoskeletal: Positive for myalgias.  Skin: Negative for rash.  Neurological: Negative for dizziness, weakness and light-headedness.    Physical Exam Updated Vital Signs BP (!) 153/97 (BP Location: Right Arm)   Pulse 60   Temp 97.8 F (36.6 C)   Resp 19   SpO2 97%   Physical Exam Vitals and nursing note reviewed.  Constitutional:      General: He is not in acute distress.    Appearance: He is well-developed.     Comments: Speaking complete sentences without difficulty  HENT:     Head: Normocephalic and atraumatic.     Nose: Nose  normal.  Eyes:     General: No scleral icterus.       Left eye: No discharge.     Conjunctiva/sclera: Conjunctivae normal.  Cardiovascular:     Rate and Rhythm: Normal rate and regular rhythm.     Heart sounds: Normal heart sounds. No murmur heard. No friction rub. No gallop.   Pulmonary:     Effort: Pulmonary effort is normal. No respiratory distress.     Breath sounds: Normal breath sounds.  Abdominal:     General: There is no distension.  Musculoskeletal:        General: Normal range of motion.     Cervical back: Normal range of motion and neck supple.       Back:     Comments: Tenderness palpation of the right posterior shoulder blade area.  Moving all extremities without difficulty.  Normal range of motion of right shoulder.  No deformities.  Strength 5/5 in bilateral upper extremities.  No objective signs of numbness.  2+ radial pulse noted bilaterally  Skin:    General: Skin is warm and dry.     Findings: No rash.  Neurological:     Mental Status: He is alert.     Motor: No abnormal muscle tone.     Coordination: Coordination normal.     ED Results / Procedures / Treatments   Labs (all labs ordered are listed, but only abnormal results are displayed) Labs Reviewed - No data to display  EKG None  Radiology DG Chest 2 View  Result Date: 07/15/2020 CLINICAL DATA:  Back pain and some shortness of breath.  Smoker. EXAM: CHEST - 2 VIEW COMPARISON:  None. FINDINGS: Normal sized heart. Clear lungs. The lungs are mildly hyperexpanded with diffuse peribronchial thickening. Small amount of linear atelectasis or scarring at the left lateral lung base. Minimal thoracic spine degenerative changes. Mild to moderate right AC joint degenerative changes. IMPRESSION: 1. Changes of COPD and chronic bronchitis. 2. No acute abnormality. Electronically Signed   By: Beckie Salts M.D.   On: 07/15/2020 12:18    Procedures Procedures   Medications Ordered in ED Medications  methocarbamol  (ROBAXIN) tablet 500 mg (500 mg Oral Given 07/15/20 1240)  acetaminophen (TYLENOL) tablet 650 mg (650 mg Oral Given 07/15/20 1239)    ED Course  I have reviewed the triage vital signs and the nursing notes.  Pertinent labs & imaging results that were available during my care of the patient were reviewed by me and considered in my medical decision making (see chart for details).  Clinical Course as of 07/15/20 1412  Sat Jul 15, 2020  1244 DG Chest 2 View COPD changes without any acute abnormalities. [HK]    Clinical Course User Index [HK] Dietrich Pates, PA-C   MDM Rules/Calculators/A&P  65 year old male presenting to the ED for right posterior shoulder pain since waking up this morning.  He believes he may have pulled a muscle as he was lifting a heavy TV yesterday and has not done so in a long time.  Some improvement noted with NSAIDs and muscle rub prior to arrival.  Reports some worsening pain when taking a deep breath but otherwise no shortness of breath at rest.  No chest pain.  No numbness or weakness.  No deformities or injuries.  On exam there is tender palpation of the musculature of the right posterior shoulder area.  No changes to range of motion, no changes to sensation or weakness.  Will obtain EKG and chest x-ray due to unusual location of pain but will attempt to treat as muscle strain in the meantime.  EKG shows sinus bradycardia, no ischemic changes, no STEMI.  Chest x-ray without any acute abnormal findings.  Suspect that symptoms are due to muscle strain.  Doubt infectious or vascular cause patient with improvement in symptoms here with muscle relaxer and Tylenol.  We will have him continue NSAID gel and muscle relaxer at home.  We will have him practice range of motion to prevent stiffness.  Return precautions given.   Patient is hemodynamically stable, in NAD, and able to ambulate in the ED. Evaluation does not show pathology that would require ongoing  emergent intervention or inpatient treatment. I explained the diagnosis to the patient. Pain has been managed and has no complaints prior to discharge. Patient is comfortable with above plan and is stable for discharge at this time. All questions were answered prior to disposition. Strict return precautions for returning to the ED were discussed. Encouraged follow up with PCP.   An After Visit Summary was printed and given to the patient.   Portions of this note were generated with Scientist, clinical (histocompatibility and immunogenetics). Dictation errors may occur despite best attempts at proofreading.  Final Clinical Impression(s) / ED Diagnoses Final diagnoses:  Muscle spasm of back    Rx / DC Orders ED Discharge Orders         Ordered    methocarbamol (ROBAXIN) 500 MG tablet  2 times daily        07/15/20 1402    diclofenac Sodium (VOLTAREN) 1 % GEL  4 times daily        07/15/20 1402           Dietrich Pates, PA-C 07/15/20 1412    Gwyneth Sprout, MD 07/16/20 [phone]

## 2020-07-15 NOTE — ED Triage Notes (Signed)
Pt to triage via [facility] from home.  Pt was carrying an old box TV upstairs yesterday with assistance.  Woke up this morning with pain under R shoulder blade.  States he feels SOB with deep inspiration.  Out of BP medication x 2 days.

## 2020-07-15 NOTE — Discharge Instructions (Signed)
Take the medications to help with your symptoms. Follow-up with your primary care provider. Return to the ER if you start to experience worsening pain, numbness, weakness, chest pain or shortness of breath

## 2020-07-17 ENCOUNTER — Telehealth: Payer: Self-pay | Admitting: Internal Medicine

## 2020-07-17 NOTE — Telephone Encounter (Signed)
Call to patient-he requests a sooner appt.  States ED note had for him to return to work on Tues 05/10, but states his back is still hurting. Pt appt rescheduled from 05/11 to  5/10 Pt asked that CMA contact employer at [phone] and fax work note excusing him from work tomorrow.    Fax to   Atten: Gavin Pound fax# [phone]  Phone call complete, letter faxed, and pt will follow up tomorrow

## 2020-07-17 NOTE — Telephone Encounter (Signed)
Pt calling to report her went to the Ed over the weekend and his back pain has not gotten any better.  Patient has been sch for Wed with Dr Gwyneth Revels for 3:15 pm.  Pt is requesting a call back as he states he is excused from work until Tuesday and needs to know what to do until he is seen on Wednesday as he is to rtn to work.

## 2020-07-18 ENCOUNTER — Ambulatory Visit (INDEPENDENT_AMBULATORY_CARE_PROVIDER_SITE_OTHER): Payer: BC Managed Care – PPO | Admitting: Internal Medicine

## 2020-07-18 ENCOUNTER — Encounter: Payer: Self-pay | Admitting: Internal Medicine

## 2020-07-18 ENCOUNTER — Other Ambulatory Visit: Payer: Self-pay

## 2020-07-18 DIAGNOSIS — M549 Dorsalgia, unspecified: Secondary | ICD-10-CM | POA: Diagnosis not present

## 2020-07-18 NOTE — Progress Notes (Signed)
   CC: Back pain  HPI:  [name] is a 64 y.o. with a history of HTN, HLD, and recent ED visit for back pain presenting for back pain.   Past Medical History:  Diagnosis Date  . Murmur   . Weakness of one side of body 08/18/2011   Review of Systems:   Constitutional: Negative for chills and fever.  Respiratory: Negative for shortness of breath.   Cardiovascular: Negative for chest pain and leg swelling.  Gastrointestinal: Negative for abdominal pain, nausea and vomiting.  Musculoskeletal: Positive for back pain.  Neurological: Negative for dizziness and headaches.    Physical Exam:  Vitals:   07/18/20 1557  BP: (!) 141/92  Pulse: 84  Temp: 98.1 F (36.7 C)  TempSrc: Oral  SpO2: 100%  Weight: 164 lb 6.4 oz (74.6 kg)  Height: 5\' 9"  (1.753 m)   Physical Exam Constitutional:      Appearance: Normal appearance.  Cardiovascular:     Rate and Rhythm: Normal rate and regular rhythm.     Pulses: Normal pulses.     Heart sounds: Normal heart sounds.  Pulmonary:     Effort: Pulmonary effort is normal.     Breath sounds: Normal breath sounds.  Abdominal:     General: Abdomen is flat. Bowel sounds are normal.     Palpations: Abdomen is soft.  Musculoskeletal:     Cervical back: No swelling or tenderness. Normal range of motion.     Thoracic back: Tenderness present. Normal range of motion.     Lumbar back: No swelling or tenderness. Normal range of motion.       Back:  Skin:    Capillary Refill: Capillary refill takes less than 2 seconds.  Neurological:     General: No focal deficit present.     Mental Status: He is alert and oriented to person, place, and time.  Psychiatric:        Mood and Affect: Mood normal.        Behavior: Behavior normal.      Assessment & Plan:   See Encounters Tab for problem based charting.  Patient discussed with Dr. 

## 2020-07-18 NOTE — Patient Instructions (Signed)
Mr. Shane Franklin,  It was a pleasure to see you today. Thank you for coming in.   Today we discussed your back pain.  I am sorry that you are still feeling poorly.  I think this is related to a muscle strain that may have occurred when you were doing heavy lifting. This may take a while to heal, it can sometimes take up to 6 weeks however since this is only on one side hopefully this will heal sooner. Please continue taking the robaxin as prescribed, using the voltaren gel, and using heating pads. Please continue stretching the area.   Please return to clinic in 1 week if you are still having pain or sooner if needed.   Thank you again for coming in.   Claudean Severance.D.

## 2020-07-18 NOTE — Assessment & Plan Note (Signed)
Patient reports back pain since Saturday, he was doing a lot of heavy lifting on Friday and when he woke up he noted back pain on his right side. The pain is worse with deep breaths, when he lifts his arm, and when he lays on the area. It is very sore to the touch. He went to the ED where he had some labs drawn, had an EKG and CXR that were both unremarkable. He was given a course of Robaxin and advised to use tylenol as needed. He reports that the robaxin has been helping, as well as heat and stretching. On exam he has point tenderness on the posterior aspect of his right shoulder blade, he has pain with extension of his shoulder above the 90 degree angle, flexion of his shoulder, and pain with crossing arm, he has not pain on palpation of his shoulder, no neck stiffness or limited ROM. Location and mechanism of injury appears to be consistent with a muscle strain, possibly around the infraspinatous muscle. Discussed continuing robaxin and voltaren gel for now. Informed that symptoms often improve after 1-2 weeks however can take up to 6 weeks to resolve. 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 Will provide work note for the rest of the week, and if his symptoms persist until next Monday advised to RTC for further evaluation. If symptoms continue to persist could consider PT referral.

## 2020-07-19 ENCOUNTER — Ambulatory Visit: Payer: BC Managed Care – PPO | Admitting: Internal Medicine

## 2020-07-21 NOTE — Progress Notes (Signed)
Internal Medicine Clinic Attending ° °Case discussed with Dr. Krienke  At the time of the visit.  We reviewed the resident’s history and exam and pertinent patient test results.  I agree with the assessment, diagnosis, and plan of care documented in the resident’s note.  °

## 2021-09-01 ENCOUNTER — Encounter: Payer: Self-pay | Admitting: *Deleted

## 2021-10-16 IMAGING — CR DG CHEST 2V
2 series · 2 of 2 positions shown · non-contrast
Comparison: None.

CLINICAL DATA: Back pain and some shortness of breath.  Smoker.

EXAM:
CHEST - 2 VIEW

[chest pa]
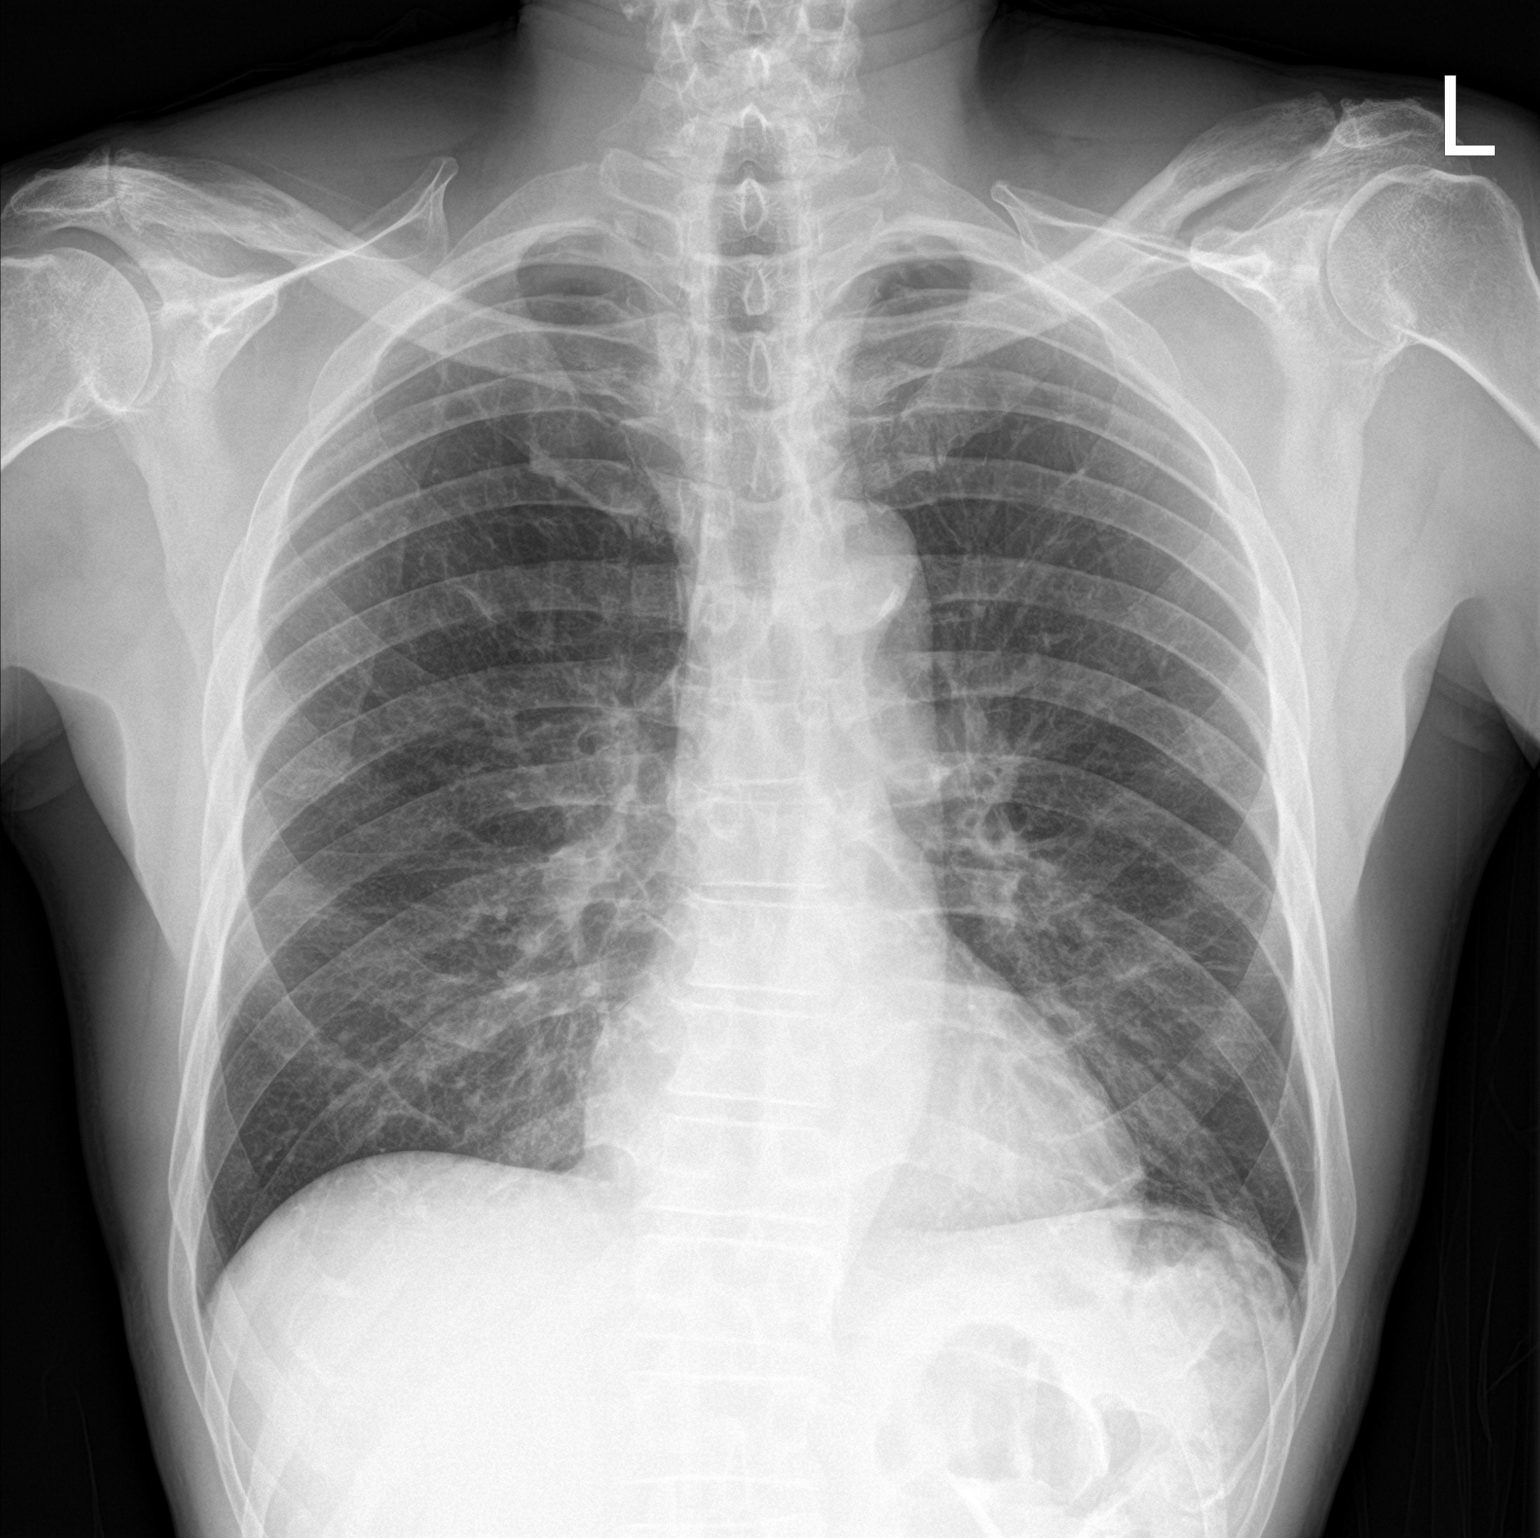

[chest lat]
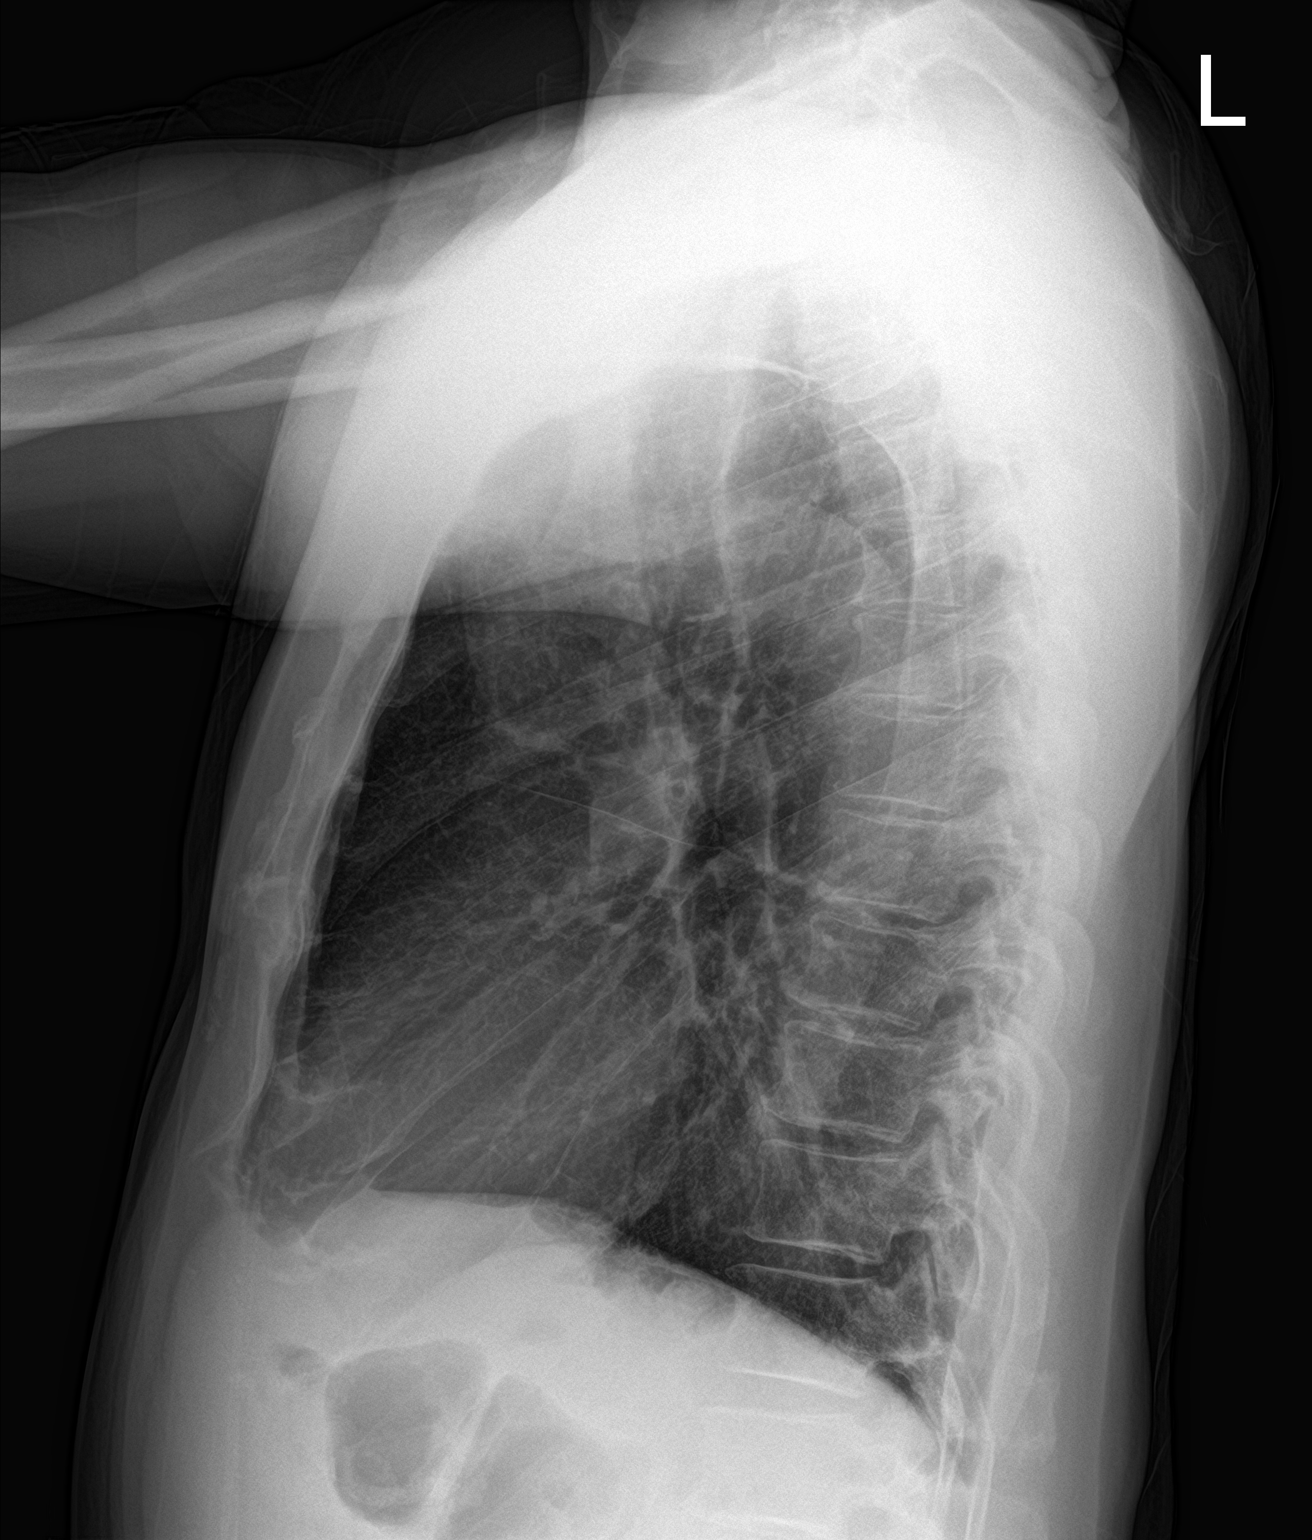

[2 of 2 positions shown; findings below may reference images not displayed]

FINDINGS: Normal sized heart. Clear lungs. The lungs are mildly hyperexpanded
with diffuse peribronchial thickening. Small amount of linear
atelectasis or scarring at the left lateral lung base. Minimal
thoracic spine degenerative changes. Mild to moderate right AC joint
degenerative changes.
IMPRESSION: 1. Changes of COPD and chronic bronchitis.
2. No acute abnormality.

## 2022-02-21 ENCOUNTER — Other Ambulatory Visit: Payer: Self-pay

## 2022-02-21 ENCOUNTER — Emergency Department (HOSPITAL_COMMUNITY): Payer: No Typology Code available for payment source

## 2022-02-21 ENCOUNTER — Emergency Department (HOSPITAL_COMMUNITY)
Admission: EM | Admit: 2022-02-21 | Discharge: 2022-02-21 | Disposition: A | Discharge status: Home / Self Care | Payer: No Typology Code available for payment source | Attending: Emergency Medicine | Admitting: Emergency Medicine

## 2022-02-21 DIAGNOSIS — I6782 Cerebral ischemia: Secondary | ICD-10-CM | POA: Diagnosis not present

## 2022-02-21 DIAGNOSIS — M25512 Pain in left shoulder: Secondary | ICD-10-CM | POA: Diagnosis not present

## 2022-02-21 DIAGNOSIS — Y92410 Unspecified street and highway as the place of occurrence of the external cause: Secondary | ICD-10-CM | POA: Insufficient documentation

## 2022-02-21 DIAGNOSIS — M542 Cervicalgia: Secondary | ICD-10-CM | POA: Diagnosis present

## 2022-02-21 DIAGNOSIS — S161XXA Strain of muscle, fascia and tendon at neck level, initial encounter: Secondary | ICD-10-CM | POA: Diagnosis not present

## 2022-02-21 MED ORDER — IBUPROFEN 400 MG PO TABS
600.0000 mg | ORAL_TABLET | Freq: Once | ORAL | Status: AC
  Administered 2022-02-21: 600 mg via ORAL
  Filled 2022-02-21: qty 1

## 2022-02-21 MED ORDER — ACETAMINOPHEN 325 MG PO TABS: 650.0000 mg | ORAL_TABLET | Freq: Four times a day (QID) | ORAL | 0 refills | Status: AC | PRN

## 2022-02-21 MED ORDER — CYCLOBENZAPRINE HCL 10 MG PO TABS: 10.0000 mg | ORAL_TABLET | Freq: Two times a day (BID) | ORAL | 0 refills | Status: AC | PRN

## 2022-02-21 MED ORDER — IBUPROFEN 600 MG PO TABS: 600.0000 mg | ORAL_TABLET | Freq: Three times a day (TID) | ORAL | 0 refills | Status: AC | PRN

## 2022-02-21 MED ORDER — CYCLOBENZAPRINE HCL 10 MG PO TABS
10.0000 mg | ORAL_TABLET | Freq: Once | ORAL | Status: AC
  Administered 2022-02-21: 10 mg via ORAL
  Filled 2022-02-21: qty 1

## 2022-02-21 MED ORDER — ACETAMINOPHEN 325 MG PO TABS
650.0000 mg | ORAL_TABLET | Freq: Once | ORAL | Status: AC
  Administered 2022-02-21: 650 mg via ORAL
  Filled 2022-02-21: qty 2
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
## 2022-02-21 NOTE — ED Notes (Signed)
Pt verbalized understanding of follow up, medications and sling, pt ambulatory to WR with steady gait. NAD
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
## 2022-05-22 DIAGNOSIS — Z79899 Other long term (current) drug therapy: Secondary | ICD-10-CM | POA: Diagnosis not present

## 2023-09-01 ENCOUNTER — Other Ambulatory Visit: Payer: Self-pay | Admitting: Family

## 2023-09-01 DIAGNOSIS — I70228 Atherosclerosis of native arteries of extremities with rest pain, other extremity: Secondary | ICD-10-CM

## 2023-09-02 ENCOUNTER — Ambulatory Visit
Admission: RE | Admit: 2023-09-02 | Discharge: 2023-09-02 | Disposition: A | Discharge status: Home / Self Care | Source: Ambulatory Visit | Attending: Family | Admitting: Family

## 2023-09-02 DIAGNOSIS — I70228 Atherosclerosis of native arteries of extremities with rest pain, other extremity: Secondary | ICD-10-CM
